# Patient Record
Sex: Male | Born: 1950 | Race: Black or African American | Hispanic: No | Marital: Married | Smoking: Former smoker
Health system: Southern US, Community
[De-identification: ages and names within clinical notes are randomized; demographics above are authoritative.]

## PROBLEM LIST (undated history)

## (undated) DIAGNOSIS — N183 Chronic kidney disease, stage 3 (moderate): Secondary | ICD-10-CM

## (undated) DIAGNOSIS — I10 Essential (primary) hypertension: Secondary | ICD-10-CM

## (undated) DIAGNOSIS — I509 Heart failure, unspecified: Secondary | ICD-10-CM

---

## 2005-02-20 ENCOUNTER — Ambulatory Visit (HOSPITAL_COMMUNITY): Admission: RE | Admit: 2005-02-20 | Discharge: 2005-02-20 | Payer: Self-pay | Admitting: Pulmonary Disease

## 2005-05-01 ENCOUNTER — Ambulatory Visit (HOSPITAL_COMMUNITY): Admission: RE | Admit: 2005-05-01 | Discharge: 2005-05-01 | Payer: Self-pay | Admitting: Pulmonary Disease

## 2005-05-07 ENCOUNTER — Inpatient Hospital Stay (HOSPITAL_COMMUNITY): Admission: AD | Admit: 2005-05-07 | Discharge: 2005-05-15 | Payer: Self-pay | Admitting: Cardiovascular Disease

## 2014-08-27 ENCOUNTER — Inpatient Hospital Stay (HOSPITAL_COMMUNITY): Payer: BC Managed Care – PPO

## 2014-08-27 ENCOUNTER — Inpatient Hospital Stay (HOSPITAL_COMMUNITY)
Admission: EM | Admit: 2014-08-27 | Discharge: 2014-08-29 | DRG: 101 | Disposition: A | Discharge status: Home / Self Care | Payer: BC Managed Care – PPO | Attending: Internal Medicine | Admitting: Internal Medicine

## 2014-08-27 ENCOUNTER — Emergency Department (HOSPITAL_COMMUNITY): Payer: BC Managed Care – PPO

## 2014-08-27 ENCOUNTER — Encounter (HOSPITAL_COMMUNITY): Payer: Self-pay

## 2014-08-27 DIAGNOSIS — I509 Heart failure, unspecified: Secondary | ICD-10-CM

## 2014-08-27 DIAGNOSIS — E876 Hypokalemia: Secondary | ICD-10-CM | POA: Diagnosis present

## 2014-08-27 DIAGNOSIS — E871 Hypo-osmolality and hyponatremia: Secondary | ICD-10-CM | POA: Diagnosis present

## 2014-08-27 DIAGNOSIS — I5022 Chronic systolic (congestive) heart failure: Secondary | ICD-10-CM | POA: Diagnosis present

## 2014-08-27 DIAGNOSIS — N184 Chronic kidney disease, stage 4 (severe): Secondary | ICD-10-CM | POA: Diagnosis present

## 2014-08-27 DIAGNOSIS — R569 Unspecified convulsions: Secondary | ICD-10-CM | POA: Diagnosis present

## 2014-08-27 DIAGNOSIS — Z79899 Other long term (current) drug therapy: Secondary | ICD-10-CM

## 2014-08-27 DIAGNOSIS — Z87891 Personal history of nicotine dependence: Secondary | ICD-10-CM

## 2014-08-27 DIAGNOSIS — I129 Hypertensive chronic kidney disease with stage 1 through stage 4 chronic kidney disease, or unspecified chronic kidney disease: Secondary | ICD-10-CM | POA: Diagnosis present

## 2014-08-27 DIAGNOSIS — N183 Chronic kidney disease, stage 3 (moderate): Secondary | ICD-10-CM

## 2014-08-27 DIAGNOSIS — F1010 Alcohol abuse, uncomplicated: Secondary | ICD-10-CM | POA: Diagnosis present

## 2014-08-27 DIAGNOSIS — I10 Essential (primary) hypertension: Secondary | ICD-10-CM

## 2014-08-27 DIAGNOSIS — N1832 Chronic kidney disease, stage 3b: Secondary | ICD-10-CM | POA: Diagnosis present

## 2014-08-27 HISTORY — DX: Chronic kidney disease, stage 3 (moderate): N18.3

## 2014-08-27 HISTORY — DX: Heart failure, unspecified: I50.9

## 2014-08-27 HISTORY — DX: Essential (primary) hypertension: I10

## 2014-08-27 HISTORY — DX: Chronic kidney disease, stage 3b: N18.32

## 2014-08-27 LAB — I-STAT VENOUS BLOOD GAS, ED
ACID-BASE EXCESS: 3 mmol/L — AB (ref 0.0–2.0)
BICARBONATE: 26.6 meq/L — AB (ref 20.0–24.0)
O2 SAT: 81 %
TCO2: 28 mmol/L (ref 0–100)
pCO2, Ven: 38.4 mmHg — ABNORMAL LOW (ref 45.0–50.0)
pH, Ven: 7.449 — ABNORMAL HIGH (ref 7.250–7.300)
pO2, Ven: 44 mmHg (ref 30.0–45.0)

## 2014-08-27 LAB — COMPREHENSIVE METABOLIC PANEL
ALT: 185 U/L — ABNORMAL HIGH (ref 0–53)
AST: 534 U/L — ABNORMAL HIGH (ref 0–37)
Albumin: 3 g/dL — ABNORMAL LOW (ref 3.5–5.2)
Alkaline Phosphatase: 57 U/L (ref 39–117)
Anion gap: 14 (ref 5–15)
BUN: 21 mg/dL (ref 6–23)
CO2: 25 mmol/L (ref 19–32)
Calcium: 8 mg/dL — ABNORMAL LOW (ref 8.4–10.5)
Chloride: 89 mmol/L — ABNORMAL LOW (ref 96–112)
Creatinine, Ser: 2.46 mg/dL — ABNORMAL HIGH (ref 0.50–1.35)
GFR calc Af Amer: 30 mL/min — ABNORMAL LOW (ref >90)
GFR calc non Af Amer: 26 mL/min — ABNORMAL LOW (ref >90)
Glucose, Bld: 154 mg/dL — ABNORMAL HIGH (ref 70–99)
Potassium: 2.9 mmol/L — ABNORMAL LOW (ref 3.5–5.1)
Sodium: 128 mmol/L — ABNORMAL LOW (ref 135–145)
Total Bilirubin: 1.6 mg/dL — ABNORMAL HIGH (ref 0.3–1.2)
Total Protein: 5.9 g/dL — ABNORMAL LOW (ref 6.0–8.3)

## 2014-08-27 LAB — URINALYSIS, ROUTINE W REFLEX MICROSCOPIC
Bilirubin Urine: NEGATIVE
Glucose, UA: NEGATIVE mg/dL
Ketones, ur: NEGATIVE mg/dL
Leukocytes, UA: NEGATIVE
Nitrite: NEGATIVE
PH: 6 (ref 5.0–8.0)
Protein, ur: 100 mg/dL — AB
Specific Gravity, Urine: 1.006 (ref 1.005–1.030)
UROBILINOGEN UA: 0.2 mg/dL (ref 0.0–1.0)

## 2014-08-27 LAB — RAPID URINE DRUG SCREEN, HOSP PERFORMED
Amphetamines: NOT DETECTED
Barbiturates: NOT DETECTED
Benzodiazepines: NOT DETECTED
Cocaine: NOT DETECTED
Opiates: NOT DETECTED
Tetrahydrocannabinol: NOT DETECTED

## 2014-08-27 LAB — CARBOXYHEMOGLOBIN
Carboxyhemoglobin: 1.8 % — ABNORMAL HIGH (ref 0.5–1.5)
METHEMOGLOBIN: 1 % (ref 0.0–1.5)
O2 Saturation: 79.3 %
Total hemoglobin: 17.3 g/dL (ref 13.5–18.0)

## 2014-08-27 LAB — I-STAT CHEM 8, ED
BUN: 22 mg/dL (ref 6–23)
Calcium, Ion: 1 mmol/L — ABNORMAL LOW (ref 1.13–1.30)
Chloride: 88 mmol/L — ABNORMAL LOW (ref 96–112)
Creatinine, Ser: 2.5 mg/dL — ABNORMAL HIGH (ref 0.50–1.35)
Glucose, Bld: 153 mg/dL — ABNORMAL HIGH (ref 70–99)
HCT: 43 % (ref 39.0–52.0)
Hemoglobin: 14.6 g/dL (ref 13.0–17.0)
Potassium: 2.9 mmol/L — ABNORMAL LOW (ref 3.5–5.1)
Sodium: 127 mmol/L — ABNORMAL LOW (ref 135–145)
TCO2: 21 mmol/L (ref 0–100)

## 2014-08-27 LAB — CBC WITH DIFFERENTIAL/PLATELET
Basophils Absolute: 0 10*3/uL (ref 0.0–0.1)
Basophils Relative: 0 % (ref 0–1)
Eosinophils Absolute: 0 10*3/uL (ref 0.0–0.7)
Eosinophils Relative: 0 % (ref 0–5)
HCT: 36.5 % — ABNORMAL LOW (ref 39.0–52.0)
Hemoglobin: 13.2 g/dL (ref 13.0–17.0)
Lymphocytes Relative: 10 % — ABNORMAL LOW (ref 12–46)
Lymphs Abs: 1.2 10*3/uL (ref 0.7–4.0)
MCH: 31.2 pg (ref 26.0–34.0)
MCHC: 36.2 g/dL — ABNORMAL HIGH (ref 30.0–36.0)
MCV: 86.3 fL (ref 78.0–100.0)
Monocytes Absolute: 0.9 10*3/uL (ref 0.1–1.0)
Monocytes Relative: 8 % (ref 3–12)
Neutro Abs: 9 10*3/uL — ABNORMAL HIGH (ref 1.7–7.7)
Neutrophils Relative %: 82 % — ABNORMAL HIGH (ref 43–77)
Platelets: 108 10*3/uL — ABNORMAL LOW (ref 150–400)
RBC: 4.23 MIL/uL (ref 4.22–5.81)
RDW: 15 % (ref 11.5–15.5)
WBC: 11.1 10*3/uL — ABNORMAL HIGH (ref 4.0–10.5)

## 2014-08-27 LAB — DIGOXIN LEVEL: Digoxin Level: 0.9 ng/mL (ref 0.8–2.0)

## 2014-08-27 LAB — POTASSIUM: Potassium: 3 mmol/L — ABNORMAL LOW (ref 3.5–5.1)

## 2014-08-27 LAB — MAGNESIUM: Magnesium: 1.8 mg/dL (ref 1.5–2.5)

## 2014-08-27 LAB — OSMOLALITY, URINE: Osmolality, Ur: 120 mOsm/kg — ABNORMAL LOW (ref 390–1090)

## 2014-08-27 LAB — URINE MICROSCOPIC-ADD ON

## 2014-08-27 LAB — LACTIC ACID, PLASMA: Lactic Acid, Venous: 3.4 mmol/L (ref 0.5–2.0)

## 2014-08-27 LAB — CBG MONITORING, ED: Glucose-Capillary: 155 mg/dL — ABNORMAL HIGH (ref 70–99)

## 2014-08-27 LAB — CREATININE, URINE, RANDOM: CREATININE, URINE: 46.76 mg/dL

## 2014-08-27 LAB — OSMOLALITY: OSMOLALITY: 272 mosm/kg — AB (ref 275–300)

## 2014-08-27 LAB — ETHANOL: Alcohol, Ethyl (B): <5 mg/dL (ref 0–9)

## 2014-08-27 LAB — AMMONIA: Ammonia: 28 umol/L (ref 11–32)

## 2014-08-27 LAB — SODIUM, URINE, RANDOM

## 2014-08-27 MED ORDER — CETYLPYRIDINIUM CHLORIDE 0.05 % MT LIQD
7.0000 mL | Freq: Two times a day (BID) | OROMUCOSAL | Status: DC
  Administered 2014-08-28: 7 mL via OROMUCOSAL

## 2014-08-27 MED ORDER — POTASSIUM CHLORIDE CRYS ER 20 MEQ PO TBCR
20.0000 meq | EXTENDED_RELEASE_TABLET | Freq: Every day | ORAL | Status: DC
  Administered 2014-08-27 – 2014-08-29 (×3): 20 meq via ORAL
  Filled 2014-08-27 (×3): qty 1

## 2014-08-27 MED ORDER — SODIUM CHLORIDE 0.9 % IV BOLUS (SEPSIS)
1000.0000 mL | Freq: Once | INTRAVENOUS | Status: AC
  Administered 2014-08-27: 1000 mL via INTRAVENOUS

## 2014-08-27 MED ORDER — SODIUM CHLORIDE 0.9 % IJ SOLN
3.0000 mL | Freq: Two times a day (BID) | INTRAMUSCULAR | Status: DC
  Administered 2014-08-27 – 2014-08-29 (×2): 3 mL via INTRAVENOUS

## 2014-08-27 MED ORDER — CLONIDINE HCL 0.1 MG PO TABS
0.2000 mg | ORAL_TABLET | Freq: Two times a day (BID) | ORAL | Status: DC
  Filled 2014-08-27: qty 2

## 2014-08-27 MED ORDER — LISINOPRIL 20 MG PO TABS
20.0000 mg | ORAL_TABLET | Freq: Once | ORAL | Status: AC
  Administered 2014-08-27: 20 mg via ORAL
  Filled 2014-08-27: qty 1

## 2014-08-27 MED ORDER — ALUM & MAG HYDROXIDE-SIMETH 200-200-20 MG/5ML PO SUSP: 30.0000 mL | Freq: Four times a day (QID) | ORAL | Status: DC | PRN

## 2014-08-27 MED ORDER — POLYETHYLENE GLYCOL 3350 17 G PO PACK: 17.0000 g | PACK | Freq: Every day | ORAL | Status: DC | PRN

## 2014-08-27 MED ORDER — ONDANSETRON HCL 4 MG PO TABS: 4.0000 mg | ORAL_TABLET | Freq: Four times a day (QID) | ORAL | Status: DC | PRN

## 2014-08-27 MED ORDER — ALLOPURINOL 100 MG PO TABS
300.0000 mg | ORAL_TABLET | Freq: Every day | ORAL | Status: DC
  Administered 2014-08-27 – 2014-08-29 (×3): 300 mg via ORAL
  Filled 2014-08-27 (×3): qty 3

## 2014-08-27 MED ORDER — ALPRAZOLAM 0.25 MG PO TABS: 0.2500 mg | ORAL_TABLET | Freq: Two times a day (BID) | ORAL | Status: DC

## 2014-08-27 MED ORDER — POTASSIUM CHLORIDE CRYS ER 20 MEQ PO TBCR
40.0000 meq | EXTENDED_RELEASE_TABLET | ORAL | Status: AC
  Administered 2014-08-27 (×2): 40 meq via ORAL
  Filled 2014-08-27: qty 2

## 2014-08-27 MED ORDER — POTASSIUM CHLORIDE IN NACL 20-0.9 MEQ/L-% IV SOLN
INTRAVENOUS | Status: DC
  Administered 2014-08-27 – 2014-08-28 (×2): via INTRAVENOUS
  Filled 2014-08-27 (×2): qty 1000

## 2014-08-27 MED ORDER — FOLIC ACID 1 MG PO TABS
1.0000 mg | ORAL_TABLET | Freq: Every day | ORAL | Status: DC
  Administered 2014-08-27 – 2014-08-29 (×3): 1 mg via ORAL
  Filled 2014-08-27 (×3): qty 1

## 2014-08-27 MED ORDER — POTASSIUM CHLORIDE CRYS ER 20 MEQ PO TBCR
40.0000 meq | EXTENDED_RELEASE_TABLET | Freq: Once | ORAL | Status: AC
  Administered 2014-08-27: 40 meq via ORAL
  Filled 2014-08-27: qty 2

## 2014-08-27 MED ORDER — HYDROCODONE-ACETAMINOPHEN 5-325 MG PO TABS: 1.0000 | ORAL_TABLET | ORAL | Status: DC | PRN

## 2014-08-27 MED ORDER — LORAZEPAM 1 MG PO TABS
1.0000 mg | ORAL_TABLET | Freq: Four times a day (QID) | ORAL | Status: DC | PRN
  Administered 2014-08-27 – 2014-08-29 (×2): 1 mg via ORAL
  Filled 2014-08-27 (×2): qty 1

## 2014-08-27 MED ORDER — HEPARIN SODIUM (PORCINE) 5000 UNIT/ML IJ SOLN
5000.0000 [IU] | Freq: Three times a day (TID) | INTRAMUSCULAR | Status: DC
  Administered 2014-08-27 – 2014-08-29 (×6): 5000 [IU] via SUBCUTANEOUS
  Filled 2014-08-27 (×6): qty 1

## 2014-08-27 MED ORDER — GUAIFENESIN-DM 100-10 MG/5ML PO SYRP: 5.0000 mL | ORAL_SOLUTION | ORAL | Status: DC | PRN

## 2014-08-27 MED ORDER — HYDRALAZINE HCL 20 MG/ML IJ SOLN
10.0000 mg | Freq: Four times a day (QID) | INTRAMUSCULAR | Status: DC | PRN
  Administered 2014-08-27 – 2014-08-29 (×3): 10 mg via INTRAVENOUS
  Filled 2014-08-27 (×3): qty 1

## 2014-08-27 MED ORDER — VITAMIN B-1 100 MG PO TABS
100.0000 mg | ORAL_TABLET | Freq: Every day | ORAL | Status: DC
Start: 2014-08-27 — End: 2014-08-29
  Administered 2014-08-27 – 2014-08-29 (×3): 100 mg via ORAL
  Filled 2014-08-27 (×3): qty 1

## 2014-08-27 MED ORDER — CLONIDINE HCL 0.2 MG PO TABS
0.2000 mg | ORAL_TABLET | Freq: Once | ORAL | Status: AC
  Administered 2014-08-27: 0.2 mg via ORAL
  Filled 2014-08-27: qty 1

## 2014-08-27 MED ORDER — LORAZEPAM 2 MG/ML IJ SOLN: 0.0000 mg | Freq: Two times a day (BID) | INTRAMUSCULAR | Status: DC

## 2014-08-27 MED ORDER — THIAMINE HCL 100 MG/ML IJ SOLN: 100.0000 mg | Freq: Every day | INTRAMUSCULAR | Status: DC

## 2014-08-27 MED ORDER — DIGOXIN 125 MCG PO TABS
250.0000 ug | ORAL_TABLET | Freq: Every day | ORAL | Status: DC
  Administered 2014-08-27 – 2014-08-29 (×3): 250 ug via ORAL
  Filled 2014-08-27 (×3): qty 2

## 2014-08-27 MED ORDER — ADULT MULTIVITAMIN W/MINERALS CH
1.0000 | ORAL_TABLET | Freq: Every day | ORAL | Status: DC
  Administered 2014-08-27 – 2014-08-29 (×3): 1 via ORAL
  Filled 2014-08-27 (×3): qty 1

## 2014-08-27 MED ORDER — LORAZEPAM 2 MG/ML IJ SOLN: 0.0000 mg | Freq: Four times a day (QID) | INTRAMUSCULAR | Status: AC

## 2014-08-27 MED ORDER — LORAZEPAM 2 MG/ML IJ SOLN: 2.0000 mg | INTRAMUSCULAR | Status: DC | PRN

## 2014-08-27 MED ORDER — ONDANSETRON HCL 4 MG/2ML IJ SOLN: 4.0000 mg | Freq: Four times a day (QID) | INTRAMUSCULAR | Status: DC | PRN

## 2014-08-27 MED ORDER — LORAZEPAM 2 MG/ML IJ SOLN
1.0000 mg | Freq: Four times a day (QID) | INTRAMUSCULAR | Status: DC | PRN
Start: 2014-08-27 — End: 2014-08-29

## 2014-08-27 MED ORDER — TRAZODONE HCL 100 MG PO TABS
100.0000 mg | ORAL_TABLET | Freq: Every day | ORAL | Status: DC
  Administered 2014-08-27 – 2014-08-28 (×2): 100 mg via ORAL
  Filled 2014-08-27 (×2): qty 1

## 2014-08-27 MED ORDER — CLONIDINE HCL 0.1 MG PO TABS
0.2000 mg | ORAL_TABLET | Freq: Three times a day (TID) | ORAL | Status: DC
  Administered 2014-08-27 – 2014-08-29 (×6): 0.2 mg via ORAL
  Filled 2014-08-27 (×6): qty 2

## 2014-08-27 MED ORDER — HYDRALAZINE HCL 50 MG PO TABS
50.0000 mg | ORAL_TABLET | Freq: Three times a day (TID) | ORAL | Status: DC
  Administered 2014-08-27 – 2014-08-29 (×5): 50 mg via ORAL
  Filled 2014-08-27 (×5): qty 1

## 2014-08-27 MED ORDER — CARVEDILOL 12.5 MG PO TABS
12.5000 mg | ORAL_TABLET | Freq: Two times a day (BID) | ORAL | Status: DC
  Administered 2014-08-27 – 2014-08-29 (×5): 12.5 mg via ORAL
  Filled 2014-08-27 (×7): qty 1

## 2014-08-27 MED ORDER — HYDRALAZINE HCL 20 MG/ML IJ SOLN
10.0000 mg | Freq: Once | INTRAMUSCULAR | Status: AC
  Administered 2014-08-27: 10 mg via INTRAVENOUS
  Filled 2014-08-27: qty 1

## 2014-08-27 NOTE — Progress Notes (Signed)
EEG completed; results pending.    

## 2014-08-27 NOTE — ED Notes (Signed)
Pt's wife reports patient was in the garage last night with car running and passed out in the drivers seat. Pt denies this was intentional and denies suicidal ideation. Admitting MD aware of this incident and will order carboxyhemoglobin.

## 2014-08-27 NOTE — H&P (Addendum)
Patient Demographics  Logan Thomas, is a 64 y.o. male  MRN: [id number]   DOB - December 23, 1950  Admit Date - 08/27/2014  Outpatient Primary MD for the patient is Leola Brazil, MD   With History of -  Past Medical History  Diagnosis Date  . Hypertension   . CHF (congestive heart failure)   . 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 Essential hypertension   . CHF (congestive heart failure)       No past surgical history on file.  in for   Chief Complaint  Patient presents with  . Altered Mental Status     HPI  Logan Thomas  is a 64 y.o. male, with history of essential hypertension, chronic and it disease stage IV Baseline creatinine not in system, chronic congestive heart failure unclear type no echo in the chart, history of alcohol abuse supposedly much improved now and drinking only 2-3 times a week for the last 1 month, not very forthcoming with information. History of seizure several years ago thought to be secondary to alcohol withdrawal.  Patient comes in after having a seizure-like episode this morning, he also had a passing out spell while sitting in his car yesterday, currently denies any fever or chills or headache, no head injuries, no history of stroke, gives inconsistent alcohol intake history, no chest or abdominal pain, no coughing shortness of breath, no exposure to sick contacts. No focal weakness.  In the ER lab work suggestive of chronic kidney disease stage IV, he is hyponatremic with hypokalemia, head CT unremarkable. Neuro was consulted and I was requested to admit the patient for seizures.    Review of Systems  currently negative  In addition to the HPI above,   No Fever-chills, No Headache, No changes with Vision or  hearing, No problems swallowing food or Liquids, No Chest pain, Cough or Shortness of Breath, No Abdominal pain, No Nausea or Vommitting, Bowel movements are regular, No Blood in stool or Urine, No dysuria, No new skin rashes or bruises, No new joints pains-aches,  No new weakness, tingling, numbness in any extremity, No recent weight gain or loss, No polyuria, polydypsia or polyphagia, No significant Mental Stressors.  A full 10 point Review of Systems was done, except as stated above, all other Review of Systems were negative.   Social History History  Substance Use Topics  . Smoking status: Former Research scientist (life sciences)  . Smokeless tobacco: Not on file  . Alcohol Use: Yes      Family History No history of seizures or stroke  Prior to Admission medications   Medication Sig Start Date End Date Taking? Authorizing Provider  allopurinol (ZYLOPRIM) 300 MG tablet Take 300 mg by mouth daily. 07/08/14  Yes Historical Provider, MD  ALPRAZolam Duanne Moron) 0.25 MG tablet Take 0.25 mg by mouth 2 (two) times daily. 06/01/14  Yes Historical Provider,  MD  carvedilol (COREG) 12.5 MG tablet Take 12.5 mg by mouth 2 (two) times daily. 06/24/14  Yes Historical Provider, MD  cloNIDine (CATAPRES) 0.2 MG tablet Take 0.2 mg by mouth 2 (two) times daily. 06/24/14  Yes Historical Provider, MD  digoxin (LANOXIN) 0.25 MG tablet Take 250 mcg by mouth 2 (two) times daily. 07/08/14  Yes Historical Provider, MD  furosemide (LASIX) 40 MG tablet Take 40 mg by mouth 2 (two) times daily. 05/27/14  Yes Historical Provider, MD  KLOR-CON M20 20 MEQ tablet Take 20 mEq by mouth daily. 07/28/14  Yes Historical Provider, MD  lisinopril (PRINIVIL,ZESTRIL) 20 MG tablet Take 20 mg by mouth daily. 06/24/14  Yes Historical Provider, MD  Multiple Vitamins-Minerals (CENTRUM SILVER ADULT 50+) TABS Take 1 tablet by mouth daily.   Yes Historical Provider, MD  traZODone (DESYREL) 100 MG tablet Take 100 mg by mouth at bedtime. 08/01/14  Yes Historical  Provider, MD    No Known Allergies  Physical Exam  Vitals  Blood pressure 186/106, pulse 81, temperature 97.8 F (36.6 C), temperature source Oral, resp. rate 21, SpO2 99 %.   1. General middle aged [place] male lying in bed in NAD,     2. Normal affect and insight, Not Suicidal or Homicidal, Awake Alert, Oriented X 3.  3. No F.N deficits, ALL C.Nerves Intact, Strength 5/5 all 4 extremities, Sensation intact all 4 extremities, Plantars down going.  4. Ears and Eyes appear Normal, Conjunctivae clear, PERRLA. Moist Oral Mucosa.  5. Supple Neck, No JVD, No cervical lymphadenopathy appriciated, No Carotid Bruits.  6. Symmetrical Chest wall movement, Good air movement bilaterally, CTAB.  7. RRR, No Gallops, Rubs or Murmurs, No Parasternal Heave.  8. Positive Bowel Sounds, Abdomen Soft, No tenderness, No organomegaly appriciated,No rebound -guarding or rigidity.  9.  No Cyanosis, Normal Skin Turgor, No Skin Rash or Bruise.  10. Good muscle tone,  joints appear normal , no effusions, Normal ROM.  11. No Palpable Lymph Nodes in Neck or Axillae     Data Review  CBC  Recent Labs Lab 08/27/14 0817 08/27/14 0827  WBC 11.1*  --   HGB 13.2 14.6  HCT 36.5* 43.0  PLT 108*  --   MCV 86.3  --   MCH 31.2  --   MCHC 36.2*  --   RDW 15.0  --   LYMPHSABS 1.2  --   MONOABS 0.9  --   EOSABS 0.0  --   BASOSABS 0.0  --    ------------------------------------------------------------------------------------------------------------------  Chemistries   Recent Labs Lab 08/27/14 0817 08/27/14 0827  NA 128* 127*  K 2.9* 2.9*  CL 89* 88*  CO2 25  --   GLUCOSE 154* 153*  BUN 21 22  CREATININE 2.46* 2.50*  CALCIUM 8.0*  --   AST 534*  --   ALT 185*  --   ALKPHOS 57  --   BILITOT 1.6*  --    ------------------------------------------------------------------------------------------------------------------ CrCl cannot be calculated (Unknown ideal  weight.). ------------------------------------------------------------------------------------------------------------------ No results for input(s): TSH, T4TOTAL, T3FREE, THYROIDAB in the last 72 hours.  Invalid input(s): FREET3   Coagulation profile No results for input(s): INR, PROTIME in the last 168 hours. ------------------------------------------------------------------------------------------------------------------- No results for input(s): DDIMER in the last 72 hours. -------------------------------------------------------------------------------------------------------------------  Cardiac Enzymes No results for input(s): CKMB, TROPONINI, MYOGLOBIN in the last 168 hours.  Invalid input(s): CK ------------------------------------------------------------------------------------------------------------------ Invalid input(s): POCBNP   ---------------------------------------------------------------------------------------------------------------  Urinalysis No results found for: COLORURINE, APPEARANCEUR, [place], [place], [place], [place], [place], [place], [place],  UROBILINOGEN, NITRITE, LEUKOCYTESUR  ----------------------------------------------------------------------------------------------------------------  Imaging results:   Ct Head Wo Contrast  08/27/2014   CLINICAL DATA:  History of alcohol withdrawal seizures. Probable seizure last night or this morning. Found unresponsive.  EXAM: CT HEAD WITHOUT CONTRAST  TECHNIQUE: Contiguous axial images were obtained from the base of the skull through the vertex without intravenous contrast.  COMPARISON:  05/10/2005  FINDINGS: The brain has a normal appearance without evidence of atrophy, infarction, mass lesion, hemorrhage, hydrocephalus or extra-axial collection. The calvarium is unremarkable. The paranasal sinuses, middle ears and mastoids are clear.  IMPRESSION: Normal head CT   Electronically Signed   By: Nelson Chimes  M.D.   On: 08/27/2014 08:51   Dg Chest Portable 1 View  08/27/2014   CLINICAL DATA:  CHF.  Seizure this morning.  EXAM: PORTABLE CHEST - 1 VIEW  COMPARISON:  05/10/2005  FINDINGS: The heart size and mediastinal contours are within normal limits. Both lungs are clear. The visualized skeletal structures are unremarkable.  IMPRESSION: No active disease.   Electronically Signed   By: Rolm Baptise M.D.   On: 08/27/2014 08:33    My personal review of EKG: Rhythm NSR,   no Acute ST changes    Assessment & Plan   1. Seizure. Question alcohol withdrawal but patient not go forthcoming with history, will be admitted to a telemetry bed, urology consulted, CT head unremarkable. Will obtain MRI brain. Seizure precautions. When necessary IV Ativan as needed. Check EEG. Check carboxyhemoglobin level as he passed out yesterday in the car. Doubt this is the cause.   2. Alcohol abuse. Question quantity. Inconsistent history. Counseled to quit. CIWA protocol.   3. Essential hypertension. Continue home medications as needed IV hydralazine.   4. Chronic kidney disease stage IV. Baseline creatinine not consistent, requested to obtain records from Dr. Merrilee Jansky office. Gently hydrate.   Called by Dr Doylene Canard baseline Creat 2.6.   5. Hyponatremia and hypokalemia. Likely dehydration, hold Lasix, obtain urine electrolytes, gently hydrate and repeat. Potassium replaced.   6. Chronic CHF. Unclear type no echo in system. Echo report requested from Dr. Merrilee Jansky office. Were bullae per Dr. Doylene Canard EF likely preserved and he has chronic diastolic CHF. He will update the information in his note. He wants to be consulted.     DVT Prophylaxis Heparin   AM Labs Ordered, also please review Full Orders  Family Communication: Admission, patients condition and plan of care including tests being ordered have been discussed with the patient and wife who indicate understanding and agree with the plan and Code  Status.  Code Status Full  Likely DC to Home  Condition GUARDED   Time spent in minutes : 35    Lucio Litsey K M.D on 08/27/2014 at 10:04 AM  Between 7am to 7pm - Pager - [phone]  After 7pm go to [email or web address] - [facility] Office  [phone]

## 2014-08-27 NOTE — Progress Notes (Signed)
Echocardiogram 2D Echocardiogram has been performed.  Logan Thomas 08/27/2014, 1:50 PM

## 2014-08-27 NOTE — ED Notes (Signed)
[facility]- family member woke up and pt was "unresponsive." Pt has hx of alcohol withdrawal seizures. Appeared to be post-ictal to EMS. CBG 158. Confused on EMS arrival but more responsive en route. Pt reports he feels fine on arrival.

## 2014-08-27 NOTE — Procedures (Signed)
ELECTROENCEPHALOGRAM REPORT  Date of Study: 08/27/2014  Patient's Name: Logan Thomas MRN: [id number] Date of Birth: 09-26-1950  Referring Provider: Dr. Lala Lund  Clinical History: This is a 64 year old man with an episode of rigidity and unresponsiveness this morning. Patient is still lethargic and is not yet at his baseline mental status. His wife reports that he was lethargic all day yesterday as well.   Medications: allopurinol (ZYLOPRIM) tablet 300 mg  ALPRAZolam (XANAX) tablet 0.25 mg  alum & mag hydroxide-simeth (MAALOX/MYLANTA) 200-200-20 MG/5ML suspension 30 mL  carvedilol (COREG) tablet 12.5 mg  cloNIDine (CATAPRES) tablet 0.2 mg  digoxin (LANOXIN) tablet [id number] mcg  folic acid (FOLVITE) tablet 1 mg  guaiFENesin-dextromethorphan (ROBITUSSIN DM) 100-10 MG/5ML syrup 5 mL  lisinopril (PRINIVIL,ZESTRIL) tablet 20 mg  thiamine (B-1) injection 100 mg  traZODone (DESYREL) tablet 100 mg   Technical Summary: A multichannel digital EEG recording measured by the international 10-20 system with electrodes applied with paste and impedances below 5000 ohms performed in our laboratory with EKG monitoring in a predominantly drowsy and asleep patient.  Hyperventilation and photic stimulation were performed.  The digital EEG was referentially recorded, reformatted, and digitally filtered in a variety of bipolar and referential montages for optimal display.    Description: The patient is predominantly drowsy and asleep during the recording.  During brief period of wakefulness, there is a symmetric, low to medium voltage 8 Hz posterior dominant rhythm that poorly attenuates to eye opening and eye closure. The record is symmetric.  During drowsiness and sleep, the background is attenuated with low voltage theta and delta slowing of the background.  Low voltage vertex waves and symmetric sleep spindles were seen. There are rare low voltage sharp transients seen during sleep in a generalized  fashion, seen better at 3 uV/mm sensitivity. Hyperventilation and photic stimulation did not elicit any abnormalities.  There were no clear epileptiform discharges or electrographic seizures seen.    EKG lead was unremarkable.  Impression: This predominantly drowsy and asleep EEG is within normal limits. There is diffuse low voltage activity at the beginning of the recording, however as he wakes up, the record is normal. The low voltage sharp transients seen during sleep are of unclear clinical significance.   Clinical Correlation: A normal EEG does not exclude a clinical diagnosis of epilepsy.  If further clinical questions remain, repeat EEG may be helpful. Clinical correlation is advised.   Ellouise Newer, M.D.

## 2014-08-27 NOTE — Consult Note (Signed)
Referring Physician: Dr. Marvell Fuller, Brooke Bonito.  Logan Thomas is an 64 y.o. male.                       Chief Complaint: Seizures  HPI: 64 y.o. male, with history of essential hypertension, chronic kidney disease, dilated cardiomyopathy, chronic systolic heart failure, history of alcohol abuse and history of seizure several years ago thought to be secondary to alcohol withdrawal, comes in after having a seizure-like episode this morning.  He also had a passing out spell while sitting in his car yesterday. No fever or chills or headache, no head injuries, no history of stroke, positive alcohol intake history, no chest or abdominal pain, no coughing shortness of breath, no exposure to sick contacts. No focal weakness.   Past Medical History  Diagnosis Date  . Hypertension   . CHF (congestive heart failure)   . 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 Essential hypertension   . CHF (congestive heart failure)       No past surgical history on file.  No family history on file. Social History:  reports that he has quit smoking. He does not have any smokeless tobacco history on file. He reports that he does not drink alcohol. His drug history is not on file.  Allergies: No Known Allergies  Medications Prior to Admission  Medication Sig Dispense Refill  . allopurinol (ZYLOPRIM) 300 MG tablet Take 300 mg by mouth daily.    Marland Kitchen ALPRAZolam (XANAX) 0.25 MG tablet Take 0.25 mg by mouth 2 (two) times daily.    . carvedilol (COREG) 12.5 MG tablet Take 12.5 mg by mouth 2 (two) times daily.    . cloNIDine (CATAPRES) 0.2 MG tablet Take 0.2 mg by mouth 2 (two) times daily.    . digoxin (LANOXIN) 0.25 MG tablet Take 250 mcg by mouth daily.     . furosemide (LASIX) 40 MG tablet Take 40 mg by mouth 2 (two) times daily.    Marland Kitchen KLOR-CON M20 20 MEQ tablet Take 20 mEq by mouth daily.    Marland Kitchen lisinopril (PRINIVIL,ZESTRIL) 20 MG tablet Take 20 mg by mouth daily.    .  Multiple Vitamins-Minerals (CENTRUM SILVER ADULT 50+) TABS Take 1 tablet by mouth daily.    . traZODone (DESYREL) 100 MG tablet Take 100 mg by mouth at bedtime.      Results for orders placed or performed during the hospital encounter of 08/27/14 (from the past 48 hour(s))  Comprehensive metabolic panel     Status: Abnormal   Collection Time: 08/27/14  8:17 AM  Result Value Ref Range   Sodium 128 (L) 135 - 145 mmol/L   Potassium 2.9 (L) 3.5 - 5.1 mmol/L   Chloride 89 (L) 96 - 112 mmol/L   CO2 25 19 - 32 mmol/L   Glucose, Bld 154 (H) 70 - 99 mg/dL   BUN 21 6 - 23 mg/dL   Creatinine, Ser 2.46 (H) 0.50 - 1.35 mg/dL   Calcium 8.0 (L) 8.4 - 10.5 mg/dL   Total Protein 5.9 (L) 6.0 - 8.3 g/dL   Albumin 3.0 (L) 3.5 - 5.2 g/dL   AST 534 (H) 0 - 37 U/L   ALT 185 (H) 0 - 53 U/L   Alkaline Phosphatase 57 39 - 117 U/L   Total Bilirubin 1.6 (H) 0.3 - 1.2 mg/dL   GFR calc non Af Amer 26 (L) >90 mL/min  GFR calc Af Amer 30 (L) >90 mL/min    Comment: (NOTE) The eGFR has been calculated using the CKD EPI equation. This calculation has not been validated in all clinical situations. eGFR's persistently <90 mL/min signify possible Chronic Kidney Disease.    Anion gap 14 5 - 15  CBC with Differential     Status: Abnormal   Collection Time: 08/27/14  8:17 AM  Result Value Ref Range   WBC 11.1 (H) 4.0 - 10.5 K/uL   RBC 4.23 4.22 - 5.81 MIL/uL   Hemoglobin 13.2 13.0 - 17.0 g/dL   HCT 36.5 (L) 39.0 - 52.0 %   MCV 86.3 78.0 - 100.0 fL   MCH 31.2 26.0 - 34.0 pg   MCHC 36.2 (H) 30.0 - 36.0 g/dL   RDW 15.0 11.5 - 15.5 %   Platelets 108 (L) 150 - 400 K/uL    Comment: PLATELET COUNT CONFIRMED BY SMEAR   Neutrophils Relative % 82 (H) 43 - 77 %   Neutro Abs 9.0 (H) 1.7 - 7.7 K/uL   Lymphocytes Relative 10 (L) 12 - 46 %   Lymphs Abs 1.2 0.7 - 4.0 K/uL   Monocytes Relative 8 3 - 12 %   Monocytes Absolute 0.9 0.1 - 1.0 K/uL   Eosinophils Relative 0 0 - 5 %   Eosinophils Absolute 0.0 0.0 - 0.7 K/uL    Basophils Relative 0 0 - 1 %   Basophils Absolute 0.0 0.0 - 0.1 K/uL  POC CBG, ED     Status: Abnormal   Collection Time: 08/27/14  8:20 AM  Result Value Ref Range   Glucose-Capillary 155 (H) 70 - 99 mg/dL   Comment 1 Documented in Chart    Comment 2 Notify RN   Ethanol     Status: None   Collection Time: 08/27/14  8:22 AM  Result Value Ref Range   Alcohol, Ethyl (B) <5 0 - 9 mg/dL    Comment:        LOWEST DETECTABLE LIMIT FOR SERUM ALCOHOL IS 11 mg/dL FOR MEDICAL PURPOSES ONLY   I-stat chem 8, ed     Status: Abnormal   Collection Time: 08/27/14  8:27 AM  Result Value Ref Range   Sodium 127 (L) 135 - 145 mmol/L   Potassium 2.9 (L) 3.5 - 5.1 mmol/L   Chloride 88 (L) 96 - 112 mmol/L   BUN 22 6 - 23 mg/dL   Creatinine, Ser 2.50 (H) 0.50 - 1.35 mg/dL   Glucose, Bld 153 (H) 70 - 99 mg/dL   Calcium, Ion 1.00 (L) 1.13 - 1.30 mmol/L   TCO2 21 0 - 100 mmol/L   Hemoglobin 14.6 13.0 - 17.0 g/dL   HCT 43.0 39.0 - 52.0 %  Magnesium     Status: None   Collection Time: 08/27/14  9:39 AM  Result Value Ref Range   Magnesium 1.8 1.5 - 2.5 mg/dL  Ammonia     Status: None   Collection Time: 08/27/14  9:39 AM  Result Value Ref Range   Ammonia 28 11 - 32 umol/L  Lactic acid, plasma     Status: Abnormal   Collection Time: 08/27/14  9:39 AM  Result Value Ref Range   Lactic Acid, Venous 3.4 (HH) 0.5 - 2.0 mmol/L    Comment: REPEATED TO VERIFY CRITICAL RESULT CALLED TO, READ BACK BY AND VERIFIED WITH: F.STHETTINO,RN 08/27/14 1051 BY BSLADE   Digoxin level     Status: None   Collection Time: 08/27/14  9:39 AM  Result Value Ref Range   Digoxin Level 0.9 0.8 - 2.0 ng/mL  I-Stat Venous Blood Gas, ED (order at [facility] and [facility] only)     Status: Abnormal   Collection Time: 08/27/14  9:54 AM  Result Value Ref Range   pH, Ven 7.449 (H) 7.250 - 7.300   pCO2, Ven 38.4 (L) 45.0 - 50.0 mmHg   pO2, Ven 44.0 30.0 - 45.0 mmHg   Bicarbonate 26.6 (H) 20.0 - 24.0 mEq/L   TCO2 28 0 - 100 mmol/L   O2 Saturation  81.0 %   Acid-Base Excess 3.0 (H) 0.0 - 2.0 mmol/L   Sample type VENOUS   Carboxyhemoglobin     Status: Abnormal   Collection Time: 08/27/14  9:55 AM  Result Value Ref Range   Total hemoglobin 17.3 13.5 - 18.0 g/dL   O2 Saturation 79.3 %   Carboxyhemoglobin 1.8 (H) 0.5 - 1.5 %   Methemoglobin 1.0 0.0 - 1.5 %   Ct Head Wo Contrast  08/27/2014   CLINICAL DATA:  History of alcohol withdrawal seizures. Probable seizure last night or this morning. Found unresponsive.  EXAM: CT HEAD WITHOUT CONTRAST  TECHNIQUE: Contiguous axial images were obtained from the base of the skull through the vertex without intravenous contrast.  COMPARISON:  05/10/2005  FINDINGS: The brain has a normal appearance without evidence of atrophy, infarction, mass lesion, hemorrhage, hydrocephalus or extra-axial collection. The calvarium is unremarkable. The paranasal sinuses, middle ears and mastoids are clear.  IMPRESSION: Normal head CT   Electronically Signed   By: Nelson Chimes M.D.   On: 08/27/2014 08:51   Dg Chest Portable 1 View  08/27/2014   CLINICAL DATA:  CHF.  Seizure this morning.  EXAM: PORTABLE CHEST - 1 VIEW  COMPARISON:  05/10/2005  FINDINGS: The heart size and mediastinal contours are within normal limits. Both lungs are clear. The visualized skeletal structures are unremarkable.  IMPRESSION: No active disease.   Electronically Signed   By: Rolm Baptise M.D.   On: 08/27/2014 08:33    Review Of Systems No Fever-chills, No Headache, No changes with Vision or hearing, No problems swallowing food or Liquids, No Chest pain, Cough or Shortness of Breath, No Abdominal pain, No Nausea or Vommitting, Bowel movements are regular, No Blood in stool or Urine, No dysuria, No hepatitis. No new skin rashes or bruises, No new joints pains-aches,  No new weakness, tingling, numbness in any extremity, No recent weight gain or loss, No polyuria, polydypsia or polyphagia  Blood pressure 173/99, pulse 79, temperature 98.9 F  (37.2 C), temperature source Oral, resp. rate 21, SpO2 99 %. Physical exam: HEENT: Normocephalic. Brown eyes, PERL, Bite marks on tongue and small right sided laceration of tongue. Neck: No JVD, Supple. Lungs: Clear to auscultation. Heart: S1, S2 normal. Regular rhythm. Abdomen: Soft and non-tender. Ext: No edema, cyanosis or clubbing. Skin: Warm and dry. CNS: Moves all 4 extremities. Cranial nerves grossly intact.  Assessment/Plan Seizures Syncope Alcohol use disorder Dilated cardiomyopathy Chronic kidney disease, IV Hypertension Hyponatremia  Hypokalemia  Agree with medical treatment, IV saline. Will get 2-D echo.  Birdie Riddle, MD  08/27/2014, 11:12 AM

## 2014-08-27 NOTE — ED Provider Notes (Signed)
CSN: [id number]     Arrival date & time 08/27/14  0759 History   First MD Initiated Contact with Patient 08/27/14 0800     Chief Complaint  Patient presents with  . Altered Mental Status     (Consider location/radiation/quality/duration/timing/severity/associated sxs/prior Treatment) Patient is a 64 y.o. male presenting with altered mental status. The history is provided by the patient, the EMS personnel and the spouse. A language interpreter was used.  Altered Mental Status Presenting symptoms: confusion   Severity:  Mild Most recent episode:  Today Episode history:  Single Timing:  Constant Progression:  Resolved Chronicity:  New Context: not alcohol use, not head injury, not a nursing home resident and not a recent illness   Associated symptoms: no abdominal pain, no agitation, no fever, no nausea and no vomiting     Past Medical History  Diagnosis Date  . Hypertension   . CHF (congestive heart failure)   . 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 Essential hypertension   . CHF (congestive heart failure)    No past surgical history on file. No family history on file. History  Substance Use Topics  . Smoking status: Former Research scientist (life sciences)  . Smokeless tobacco: Not on file  . Alcohol Use: No    Review of Systems  Constitutional: Negative for fever.  Respiratory: Negative for cough and shortness of breath.   Cardiovascular: Negative for chest pain.  Gastrointestinal: Negative for nausea, vomiting and abdominal pain.  Genitourinary: Negative for dysuria, urgency and frequency.  Psychiatric/Behavioral: Positive for confusion. Negative for agitation.  All other systems reviewed and are negative.     Allergies  Review of patient's allergies indicates no known allergies.  Home Medications   Prior to Admission medications   Medication Sig Start Date End Date Taking? Authorizing Provider  allopurinol (ZYLOPRIM) 300 MG tablet Take  300 mg by mouth daily. 07/08/14  Yes Historical Provider, MD  ALPRAZolam Duanne Moron) 0.25 MG tablet Take 0.25 mg by mouth 2 (two) times daily. 06/01/14  Yes Historical Provider, MD  carvedilol (COREG) 12.5 MG tablet Take 12.5 mg by mouth 2 (two) times daily. 06/24/14  Yes Historical Provider, MD  cloNIDine (CATAPRES) 0.2 MG tablet Take 0.2 mg by mouth 2 (two) times daily. 06/24/14  Yes Historical Provider, MD  digoxin (LANOXIN) 0.25 MG tablet Take 250 mcg by mouth 2 (two) times daily. 07/08/14  Yes Historical Provider, MD  furosemide (LASIX) 40 MG tablet Take 40 mg by mouth 2 (two) times daily. 05/27/14  Yes Historical Provider, MD  KLOR-CON M20 20 MEQ tablet Take 20 mEq by mouth daily. 07/28/14  Yes Historical Provider, MD  lisinopril (PRINIVIL,ZESTRIL) 20 MG tablet Take 20 mg by mouth daily. 06/24/14  Yes Historical Provider, MD  Multiple Vitamins-Minerals (CENTRUM SILVER ADULT 50+) TABS Take 1 tablet by mouth daily.   Yes Historical Provider, MD  traZODone (DESYREL) 100 MG tablet Take 100 mg by mouth at bedtime. 08/01/14  Yes Historical Provider, MD   BP 180/107 mmHg  Pulse 81  Temp(Src) 97.8 F (36.6 C) (Oral)  Resp 21  SpO2 99% Physical Exam  Constitutional: He is oriented to person, place, and time. He appears well-developed and well-nourished. No distress.  HENT:  Head: Normocephalic and atraumatic.  Bite marks to both sides of the tongue.  Small laceration to the R side of the tongue.    Eyes: Pupils are equal, round, and reactive to light.  Neck: Normal  range of motion.  Cardiovascular: Normal rate, regular rhythm and normal heart sounds.   Pulmonary/Chest: Effort normal and breath sounds normal. No respiratory distress. He has no decreased breath sounds. He has no wheezes. He has no rhonchi. He has no rales.  Abdominal: Soft. He exhibits no distension. There is no tenderness. There is no rebound and no guarding.  Musculoskeletal: He exhibits no edema or tenderness.  Neurological: He is  alert and oriented to person, place, and time. No cranial nerve deficit or sensory deficit. He exhibits normal muscle tone. Coordination and gait normal.  Strength 5/5 bilateral upper and lower extremities.  Sensation intact x4 extremities.  CN II-XII intact.    Skin: Skin is warm and dry.  Nursing note and vitals reviewed.   ED Course  Procedures (including critical care time) Labs Review Labs Reviewed  COMPREHENSIVE METABOLIC PANEL - Abnormal; Notable for the following:    Sodium 128 (*)    Potassium 2.9 (*)    Chloride 89 (*)    Glucose, Bld 154 (*)    Creatinine, Ser 2.46 (*)    Calcium 8.0 (*)    Total Protein 5.9 (*)    Albumin 3.0 (*)    AST 534 (*)    ALT 185 (*)    Total Bilirubin 1.6 (*)    GFR calc non Af Amer 26 (*)    GFR calc Af Amer 30 (*)    All other components within normal limits  CBC WITH DIFFERENTIAL/PLATELET - Abnormal; Notable for the following:    WBC 11.1 (*)    HCT 36.5 (*)    MCHC 36.2 (*)    Platelets 108 (*)    Neutrophils Relative % 82 (*)    Neutro Abs 9.0 (*)    Lymphocytes Relative 10 (*)    All other components within normal limits  CARBOXYHEMOGLOBIN - Abnormal; Notable for the following:    Carboxyhemoglobin 1.8 (*)    All other components within normal limits  CBG MONITORING, ED - Abnormal; Notable for the following:    Glucose-Capillary 155 (*)    All other components within normal limits  I-STAT CHEM 8, ED - Abnormal; Notable for the following:    Sodium 127 (*)    Potassium 2.9 (*)    Chloride 88 (*)    Creatinine, Ser 2.50 (*)    Glucose, Bld 153 (*)    Calcium, Ion 1.00 (*)    All other components within normal limits  I-STAT VENOUS BLOOD GAS, ED - Abnormal; Notable for the following:    pH, Ven 7.449 (*)    pCO2, Ven 38.4 (*)    Bicarbonate 26.6 (*)    Acid-Base Excess 3.0 (*)    All other components within normal limits  URINE CULTURE  ETHANOL  AMMONIA  URINALYSIS, ROUTINE W REFLEX MICROSCOPIC  URINE RAPID DRUG  SCREEN (HOSP PERFORMED)  MAGNESIUM  LACTIC ACID, PLASMA  DIGOXIN LEVEL  URINE RAPID DRUG SCREEN (HOSP PERFORMED)  CREATININE, URINE, RANDOM  OSMOLALITY, URINE  OSMOLALITY  SODIUM, URINE, RANDOM  CBC  CREATININE, SERUM    Imaging Review Ct Head Wo Contrast  08/27/2014   CLINICAL DATA:  History of alcohol withdrawal seizures. Probable seizure last night or this morning. Found unresponsive.  EXAM: CT HEAD WITHOUT CONTRAST  TECHNIQUE: Contiguous axial images were obtained from the base of the skull through the vertex without intravenous contrast.  COMPARISON:  05/10/2005  FINDINGS: The brain has a normal appearance without evidence of atrophy, infarction, mass  lesion, hemorrhage, hydrocephalus or extra-axial collection. The calvarium is unremarkable. The paranasal sinuses, middle ears and mastoids are clear.  IMPRESSION: Normal head CT   Electronically Signed   By: Nelson Chimes M.D.   On: 08/27/2014 08:51   Dg Chest Portable 1 View  08/27/2014   CLINICAL DATA:  CHF.  Seizure this morning.  EXAM: PORTABLE CHEST - 1 VIEW  COMPARISON:  05/10/2005  FINDINGS: The heart size and mediastinal contours are within normal limits. Both lungs are clear. The visualized skeletal structures are unremarkable.  IMPRESSION: No active disease.   Electronically Signed   By: Rolm Baptise M.D.   On: 08/27/2014 08:33     EKG Interpretation   Date/Time:  Friday August 27 2014 08:11:09 EST Ventricular Rate:  84 PR Interval:  178 QRS Duration: 120 QT Interval:  397 QTC Calculation: 469 R Axis:   39 Text Interpretation:  Normal sinus rhythm Non-specific ST-t changes  Although rate has decreased since last tracing Confirmed by RAY MD,  Andee Poles [phone]) on 08/27/2014 8:38:31 AM      MDM   Final diagnoses:  Seizure    Patient is a 64 year old Caucasian male with pertinent past medical history of hypertension and congestive heart failure who comes to the emergency department today after an episode of rigidity  and unresponsiveness this morning. Physical exam as above. Patient does have a laceration to his tongue which combined with his history is concerning for a potential seizure. Patient has had one seizure in the past however this was several years ago and he has not requried treatment for them since then. Upon arrival to the emergency room patient is still lethargic and is not yet at his baseline mental status. His wife reports that he was lethargic all day yesterday as well. Initial workup included a CBC, CBG, alcohol level, i-STAT Chem-8, CMP, CT of the head, chest x-ray, and an EKG. EKG is detailed above. I-STAT Chem-8 demonstrated a sodium of 127, potassium of 2.9, and creatinine of 2.5. Patient was treated with a liter of normal saline and 40 mg potassium to replete his electrolytes. Alcohol level was negative. CT of the head demonstrated no intracranial abnormalities.  CBC with a WBC of 11.1 otherwise unremarkable. Chest x-ray was unremarkable with no consolidations doubt pneumonia. Patient was reevaluated and continued to be lethargic after an hour and a half in the emergency department. As result patient was felt to require admission to the hospital for altered mental status after a seizure with abnormal electrolytes.  Patient also has an elevated AST and ALT.  As result an ammonia level was also obtained. He does take digoxin.  With his continued altered mental status will obtain a digoxin level as well. Patient's care was discussed with medicine and they will admit the patient to their service. They requested that neurology be involved in the emergency room. As result neurology was consulted per their request. Labs and imaging reviewed by myself and considered in medical decision making. Imaging was interpreted by radiology. Care was discussed with my attending Dr. Jeanell Sparrow.      Katheren Shams, MD 08/27/14 [place], MD 08/31/14 [phone]

## 2014-08-27 NOTE — Consult Note (Signed)
NEURO HOSPITALIST CONSULT NOTE    Reason for Consult: seizure  HPI:                                                                                                                                          Logan Thomas is an 64 y.o. male with a past medical history significant for HTN, chronic congestive heart failure, CKD stage 3, alcohol use, alcohol related seizure in 2006, brought in by EMS after sustaining a witnessed seizure this morniNG. Wife is at the bedside and said that around 7 am and while being asleep her husband started making noises that woke her up, and she noted that he was unresponsive, his arms were rigid and shaking upon his chest, eyes and mouth open, with a glazed look in his eyes. She realized that he had bitten his tongue but was not incontinent of urine or stools. The episode went on for few minutes and upon EMS arrival he was confused " and little bit restless and combative". He was still not to his baseline upon arrival to our ED. CT brain reviewed by myself showed no acute abnormality and serologies including ETOH level and UDS were unrevealing. It is worth noting that last night he apparently passed put inside his car at home. No recent fever, infection, sleep deprivation, use of narcotics, benzodiazepines, or illicit drugs. He stated that he resumed drinking vodka 3 or 4 times a week. Patient and wife Informed me that his last drink was 2 days ago. Denies HA, vertigo, double vision, focal weakness or numbness,slurred speech, language or visual impairment. No history of febrile seizures, CNS infection, severe head injury, or stroke. No family history of epilepsy.  Past Medical History  Diagnosis Date  . Hypertension   . CHF (congestive heart failure)   . 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 Essential hypertension   . CHF (congestive heart failure)     No past surgical history on file.  No  family history on file.  Family History: no epilepsy, brain tumors, or brain aneurysms.   Social History:  reports that he has quit smoking. He does not have any smokeless tobacco history on file. He reports that he does not drink alcohol. His drug history is not on file.  No Known Allergies  MEDICATIONS:  Scheduled: . allopurinol  300 mg Oral Daily  . carvedilol  12.5 mg Oral BID WC  . cloNIDine  0.2 mg Oral BID  . digoxin  250 mcg Oral Daily  . folic acid  1 mg Oral Daily  . heparin  5,000 Units Subcutaneous 3 times per day  . lisinopril  20 mg Oral Once  . LORazepam  0-4 mg Intravenous Q6H   Followed by  . [START ON 08/29/2014] LORazepam  0-4 mg Intravenous Q12H  . multivitamin with minerals  1 tablet Oral Daily  . potassium chloride SA  20 mEq Oral Daily  . sodium chloride  3 mL Intravenous Q12H  . thiamine  100 mg Oral Daily   Or  . thiamine  100 mg Intravenous Daily  . traZODone  100 mg Oral QHS     ROS:                                                                                                                                       History obtained from the patient, wife, and chart review  General ROS: negative for - chills, fatigue, fever, night sweats, weight gain or weight loss Psychological ROS: negative for - behavioral disorder, hallucinations, memory difficulties, mood swings or suicidal ideation Ophthalmic ROS: negative for - blurry vision, double vision, eye pain or loss of vision ENT ROS: negative for - epistaxis, nasal discharge, oral lesions, sore throat, tinnitus or vertigo Allergy and Immunology ROS: negative for - hives or itchy/watery eyes Hematological and Lymphatic ROS: negative for - bleeding problems, bruising or swollen lymph nodes Endocrine ROS: negative for - galactorrhea, hair pattern changes, polydipsia/polyuria or temperature  intolerance Respiratory ROS: negative for - cough, hemoptysis, shortness of breath or wheezing Cardiovascular ROS: negative for - chest pain, dyspnea on exertion, edema or irregular heartbeat Gastrointestinal ROS: negative for - abdominal pain, diarrhea, hematemesis, nausea/vomiting or stool incontinence Genito-Urinary ROS: negative for - dysuria, hematuria, incontinence or urinary frequency/urgency Musculoskeletal ROS: negative for - joint swelling or muscular weakness Neurological ROS: as noted in HPI Dermatological ROS: negative for rash and skin lesion changes  Physical exam: pleasant male in no apparent distress. Blood pressure 180/107, pulse 81, temperature 97.8 F (36.6 C), temperature source Oral, resp. rate 21, SpO2 99 %. Head: normocephalic. Neck: supple, no bruits, no JVD. Cardiac: no murmurs. Lungs: clear. Abdomen: soft, no tender, no mass. Extremities: no edema. Skin: no rash Neurologic Examination:  General: Mental Status: Alert, oriented, thought content appropriate.  Speech fluent without evidence of aphasia.  Able to follow 3 step commands without difficulty. Cranial Nerves: II: Discs flat bilaterally; Visual fields grossly normal, pupils equal, round, reactive to light and accommodation III,IV, VI: ptosis not present, extra-ocular motions intact bilaterally V,VII: smile symmetric, facial light touch sensation normal bilaterally VIII: hearing normal bilaterally IX,X: gag reflex present XI: bilateral shoulder shrug XII: midline tongue extension without atrophy or fasciculations  Motor: Right : Upper extremity   5/5    Left:     Upper extremity   5/5  Lower extremity   5/5     Lower extremity   5/5 Tone and bulk:normal tone throughout; no atrophy noted Sensory: Pinprick and light touch intact throughout, bilaterally Deep Tendon Reflexes:  Right: Upper Extremity    Left: Upper extremity   biceps (C-5 to C-6) 2/4   biceps (C-5 to C-6) 2/4 tricep (C7) 2/4    triceps (C7) 2/4 Brachioradialis (C6) 2/4  Brachioradialis (C6) 2/4  Lower Extremity Lower Extremity  quadriceps (L-2 to L-4) 2/4   quadriceps (L-2 to L-4) 2/4 Achilles (S1) 2/4   Achilles (S1) 2/4  Plantars: Right: downgoing   Left: downgoing Cerebellar: normal finger-to-nose,  normal heel-to-shin test Gait:  No tested due to multiple leads and safety reasons. CV: pulses palpable throughout    No results found for: CHOL  Results for orders placed or performed during the hospital encounter of 08/27/14 (from the past 48 hour(s))  Comprehensive metabolic panel     Status: Abnormal   Collection Time: 08/27/14  8:17 AM  Result Value Ref Range   Sodium 128 (L) 135 - 145 mmol/L   Potassium 2.9 (L) 3.5 - 5.1 mmol/L   Chloride 89 (L) 96 - 112 mmol/L   CO2 25 19 - 32 mmol/L   Glucose, Bld 154 (H) 70 - 99 mg/dL   BUN 21 6 - 23 mg/dL   Creatinine, Ser 2.46 (H) 0.50 - 1.35 mg/dL   Calcium 8.0 (L) 8.4 - 10.5 mg/dL   Total Protein 5.9 (L) 6.0 - 8.3 g/dL   Albumin 3.0 (L) 3.5 - 5.2 g/dL   AST 534 (H) 0 - 37 U/L   ALT 185 (H) 0 - 53 U/L   Alkaline Phosphatase 57 39 - 117 U/L   Total Bilirubin 1.6 (H) 0.3 - 1.2 mg/dL   GFR calc non Af Amer 26 (L) >90 mL/min   GFR calc Af Amer 30 (L) >90 mL/min    Comment: (NOTE) The eGFR has been calculated using the CKD EPI equation. This calculation has not been validated in all clinical situations. eGFR's persistently <90 mL/min signify possible Chronic Kidney Disease.    Anion gap 14 5 - 15  CBC with Differential     Status: Abnormal   Collection Time: 08/27/14  8:17 AM  Result Value Ref Range   WBC 11.1 (H) 4.0 - 10.5 K/uL   RBC 4.23 4.22 - 5.81 MIL/uL   Hemoglobin 13.2 13.0 - 17.0 g/dL   HCT 36.5 (L) 39.0 - 52.0 %   MCV 86.3 78.0 - 100.0 fL   MCH 31.2 26.0 - 34.0 pg   MCHC 36.2 (H) 30.0 - 36.0 g/dL   RDW 15.0 11.5 - 15.5 %   Platelets 108 (L)  150 - 400 K/uL    Comment: PLATELET COUNT CONFIRMED BY SMEAR   Neutrophils Relative % 82 (H) 43 - 77 %   Neutro Abs 9.0 (H) 1.7 -  7.7 K/uL   Lymphocytes Relative 10 (L) 12 - 46 %   Lymphs Abs 1.2 0.7 - 4.0 K/uL   Monocytes Relative 8 3 - 12 %   Monocytes Absolute 0.9 0.1 - 1.0 K/uL   Eosinophils Relative 0 0 - 5 %   Eosinophils Absolute 0.0 0.0 - 0.7 K/uL   Basophils Relative 0 0 - 1 %   Basophils Absolute 0.0 0.0 - 0.1 K/uL  POC CBG, ED     Status: Abnormal   Collection Time: 08/27/14  8:20 AM  Result Value Ref Range   Glucose-Capillary 155 (H) 70 - 99 mg/dL   Comment 1 Documented in Chart    Comment 2 Notify RN   Ethanol     Status: None   Collection Time: 08/27/14  8:22 AM  Result Value Ref Range   Alcohol, Ethyl (B) <5 0 - 9 mg/dL    Comment:        LOWEST DETECTABLE LIMIT FOR SERUM ALCOHOL IS 11 mg/dL FOR MEDICAL PURPOSES ONLY   I-stat chem 8, ed     Status: Abnormal   Collection Time: 08/27/14  8:27 AM  Result Value Ref Range   Sodium 127 (L) 135 - 145 mmol/L   Potassium 2.9 (L) 3.5 - 5.1 mmol/L   Chloride 88 (L) 96 - 112 mmol/L   BUN 22 6 - 23 mg/dL   Creatinine, Ser 2.50 (H) 0.50 - 1.35 mg/dL   Glucose, Bld 153 (H) 70 - 99 mg/dL   Calcium, Ion 1.00 (L) 1.13 - 1.30 mmol/L   TCO2 21 0 - 100 mmol/L   Hemoglobin 14.6 13.0 - 17.0 g/dL   HCT 43.0 39.0 - 52.0 %  Ammonia     Status: None   Collection Time: 08/27/14  9:39 AM  Result Value Ref Range   Ammonia 28 11 - 32 umol/L  I-Stat Venous Blood Gas, ED (order at [facility] and [facility] only)     Status: Abnormal   Collection Time: 08/27/14  9:54 AM  Result Value Ref Range   pH, Ven 7.449 (H) 7.250 - 7.300   pCO2, Ven 38.4 (L) 45.0 - 50.0 mmHg   pO2, Ven 44.0 30.0 - 45.0 mmHg   Bicarbonate 26.6 (H) 20.0 - 24.0 mEq/L   TCO2 28 0 - 100 mmol/L   O2 Saturation 81.0 %   Acid-Base Excess 3.0 (H) 0.0 - 2.0 mmol/L   Sample type VENOUS   Carboxyhemoglobin     Status: Abnormal   Collection Time: 08/27/14  9:55 AM  Result Value Ref  Range   Total hemoglobin 17.3 13.5 - 18.0 g/dL   O2 Saturation 79.3 %   Carboxyhemoglobin 1.8 (H) 0.5 - 1.5 %   Methemoglobin 1.0 0.0 - 1.5 %    Ct Head Wo Contrast  08/27/2014   CLINICAL DATA:  History of alcohol withdrawal seizures. Probable seizure last night or this morning. Found unresponsive.  EXAM: CT HEAD WITHOUT CONTRAST  TECHNIQUE: Contiguous axial images were obtained from the base of the skull through the vertex without intravenous contrast.  COMPARISON:  05/10/2005  FINDINGS: The brain has a normal appearance without evidence of atrophy, infarction, mass lesion, hemorrhage, hydrocephalus or extra-axial collection. The calvarium is unremarkable. The paranasal sinuses, middle ears and mastoids are clear.  IMPRESSION: Normal head CT   Electronically Signed   By: Nelson Chimes M.D.   On: 08/27/2014 08:51   Dg Chest Portable 1 View  08/27/2014   CLINICAL DATA:  CHF.  Seizure  this morning.  EXAM: PORTABLE CHEST - 1 VIEW  COMPARISON:  05/10/2005  FINDINGS: The heart size and mediastinal contours are within normal limits. Both lungs are clear. The visualized skeletal structures are unremarkable.  IMPRESSION: No active disease.   Electronically Signed   By: Rolm Baptise M.D.   On: 08/27/2014 08:33   Assessment/Plan: 64 y/o admitted after sustaining a witnessed seizure during sleep. He had a documented alcohol related seizure back in 2006 and reports drinking vodka 3 or 4 times a week, last drink 2 days ago. Non focal neuro-exam and unremarkable CT brain. Alcohol withdrawal seizure still likely. Agree with EEG and MRI brain to complete seizure work up. CIWA protocol. No AED recommended at this point. Will follow up.  Dorian Pod, MD 08/27/2014, 10:44 AM  [facility].

## 2014-08-28 DIAGNOSIS — I5022 Chronic systolic (congestive) heart failure: Secondary | ICD-10-CM | POA: Insufficient documentation

## 2014-08-28 DIAGNOSIS — E876 Hypokalemia: Secondary | ICD-10-CM

## 2014-08-28 LAB — CBC
HCT: 33.9 % — ABNORMAL LOW (ref 39.0–52.0)
HEMOGLOBIN: 12 g/dL — AB (ref 13.0–17.0)
MCH: 31.3 pg (ref 26.0–34.0)
MCHC: 35.4 g/dL (ref 30.0–36.0)
MCV: 88.3 fL (ref 78.0–100.0)
Platelets: 90 10*3/uL — ABNORMAL LOW (ref 150–400)
RBC: 3.84 MIL/uL — ABNORMAL LOW (ref 4.22–5.81)
RDW: 15.7 % — AB (ref 11.5–15.5)
WBC: 8.6 10*3/uL (ref 4.0–10.5)

## 2014-08-28 LAB — BASIC METABOLIC PANEL
Anion gap: 8 (ref 5–15)
BUN: 16 mg/dL (ref 6–23)
CALCIUM: 7.8 mg/dL — AB (ref 8.4–10.5)
CHLORIDE: 100 mmol/L (ref 96–112)
CO2: 24 mmol/L (ref 19–32)
CREATININE: 2.56 mg/dL — AB (ref 0.50–1.35)
GFR calc Af Amer: 29 mL/min — ABNORMAL LOW (ref >90)
GFR, EST NON AFRICAN AMERICAN: 25 mL/min — AB (ref >90)
GLUCOSE: 140 mg/dL — AB (ref 70–99)
Potassium: 3.1 mmol/L — ABNORMAL LOW (ref 3.5–5.1)
SODIUM: 132 mmol/L — AB (ref 135–145)

## 2014-08-28 LAB — LACTIC ACID, PLASMA: Lactic Acid, Venous: 1.4 mmol/L (ref 0.5–2.0)

## 2014-08-28 MED ORDER — POTASSIUM CHLORIDE CRYS ER 20 MEQ PO TBCR
40.0000 meq | EXTENDED_RELEASE_TABLET | Freq: Four times a day (QID) | ORAL | Status: AC
  Administered 2014-08-28 (×2): 40 meq via ORAL
  Filled 2014-08-28 (×2): qty 2

## 2014-08-28 MED ORDER — POTASSIUM CHLORIDE 10 MEQ/100ML IV SOLN
10.0000 meq | INTRAVENOUS | Status: AC
  Administered 2014-08-28 (×2): 10 meq via INTRAVENOUS
  Filled 2014-08-28 (×2): qty 100

## 2014-08-28 NOTE — Progress Notes (Signed)
[facility] PROGRESS NOTE  Logan Thomas [id number] DOB: 02-14-1951 DOA: 08/27/2014 PCP: Leola Brazil, MD  Assessment/Plan: 1. New onset seizures -Patient having unremarkable workup during this hospitalization which has included CT scanning and MRI of brain. EEG did not reveal epileptiform activity. His exam has been nonfocal. -Case discussed with neurology who did not recommend starting antiepileptic therapy -It was suspected that seizure activity could be related to alcohol  2.  Hypokalemia -A.m. labs showing potassium of 3.1 -Will replace potassium today, repeat potassium level in am  3.  Chronic systolic congestive heart failure -Currently compensated, he does not appear to have clinical signs or symptoms suggestive of acute CHF. -Transthoracic echocardiogram performed on 08/27/2014 showing ejection fraction 40-45% with diffuse hypokinesis and grade 1 diastolic dysfunction  4.  Essential hypertension. -Blood pressure stable with last blood pressure reading of 127/68 -Continue hydralazine 50 mg by mouth every 8 hours, clonidine 0.2 mg by mouth 3 times a day and Coreg 12.5 mg by mouth twice a day  Code Status: full code Family Communication: Spoke with family members present at bedside  Disposition Plan: anticipate discharge home in the next 24 hours   Consultants:  Neurology   HPI/Subjective: Patient is a 64 year old with a past medical history of hypertension, stage IV chronic disease, chronic systolic congestive heart failure, admitted to the medicine service on 08/27/2014 after having seizure-like episode after which he became unresponsive on the morning of admission. Initial workup included a CT scan of brain which did not show acute intracranial changes. Patient was seen and evaluated by neurology. Overnight he did not have any further seizure activity. He had an MRI of brain which did not show acute intracranial changes or lesions that could explain  seizures. His EEG was unremarkable.  Objective: Filed Vitals:   08/28/14 0957  BP: 127/68  Pulse: 76  Temp: 98.4 F (36.9 C)  Resp: 18    Intake/Output Summary (Last 24 hours) at 08/28/14 1359 Last data filed at 08/27/14 1721  Gross per 24 hour  Intake      0 ml  Output    500 ml  Net   -500 ml   Filed Weights   08/28/14 0500  Weight: 84.1 kg (185 lb 6.5 oz)    Exam:   General:  Patient is awake, alert, following commands  Cardiovascular: regular rate and rhythm normal S1-S2  Respiratory: clear to auscultation bilaterally no wheezing rhonchi or rales  Abdomen: soft nontender nondistended positive bowel sounds  Musculoskeletal: no edema  Neurological: Nonfocal neurologic exam  Data Reviewed: Basic Metabolic Panel:  Recent Labs Lab 08/27/14 0817 08/27/14 0827 08/27/14 0939 08/27/14 1547 08/28/14 0551  NA 128* 127*  --   --  132*  K 2.9* 2.9*  --  3.0* 3.1*  CL 89* 88*  --   --  100  CO2 25  --   --   --  24  GLUCOSE 154* 153*  --   --  140*  BUN 21 22  --   --  16  CREATININE 2.46* 2.50*  --   --  2.56*  CALCIUM 8.0*  --   --   --  7.8*  MG  --   --  1.8  --   --    Liver Function Tests:  Recent Labs Lab 08/27/14 0817  AST 534*  ALT 185*  ALKPHOS 57  BILITOT 1.6*  PROT 5.9*  ALBUMIN 3.0*   No results for input(s): LIPASE, AMYLASE in  the last 168 hours.  Recent Labs Lab 08/27/14 0939  AMMONIA 28   CBC:  Recent Labs Lab 08/27/14 0817 08/27/14 0827 08/28/14 0551  WBC 11.1*  --  8.6  NEUTROABS 9.0*  --   --   HGB 13.2 14.6 12.0*  HCT 36.5* 43.0 33.9*  MCV 86.3  --  88.3  PLT 108*  --  90*   Cardiac Enzymes: No results for input(s): CKTOTAL, CKMB, CKMBINDEX, TROPONINI in the last 168 hours. BNP (last 3 results) No results for input(s): BNP in the last 8760 hours.  ProBNP (last 3 results) No results for input(s): PROBNP in the last 8760 hours.  CBG:  Recent Labs Lab 08/27/14 0820  GLUCAP 155*    No results found for  this or any previous visit (from the past 240 hour(s)).   Studies: Ct Head Wo Contrast  08/27/2014   CLINICAL DATA:  History of alcohol withdrawal seizures. Probable seizure last night or this morning. Found unresponsive.  EXAM: CT HEAD WITHOUT CONTRAST  TECHNIQUE: Contiguous axial images were obtained from the base of the skull through the vertex without intravenous contrast.  COMPARISON:  05/10/2005  FINDINGS: The brain has a normal appearance without evidence of atrophy, infarction, mass lesion, hemorrhage, hydrocephalus or extra-axial collection. The calvarium is unremarkable. The paranasal sinuses, middle ears and mastoids are clear.  IMPRESSION: Normal head CT   Electronically Signed   By: Nelson Chimes M.D.   On: 08/27/2014 08:51   Mr Brain Wo Contrast  08/27/2014   CLINICAL DATA:  Seizure  EXAM: MRI HEAD WITHOUT CONTRAST  TECHNIQUE: Multiplanar, multiecho pulse sequences of the brain and surrounding structures were obtained without intravenous contrast.  COMPARISON:  05/11/2005  FINDINGS: Motion degraded, but overall diagnostic imaging.  Calvarium and upper cervical spine: No marrow signal abnormality.  Orbits: No significant findings.  Sinuses: The paranasal sinuses are clear. Under aerated temporal bones with likely chronic fluid or mucosal thickening in the right mastoid tip.  Brain: No acute abnormality such as acute infarct, hemorrhage, hydrocephalus, or mass lesion. No evidence of large vessel occlusion. There is confluent T2 and FLAIR hyperintensity around the lateral ventricles, with surrounding patchy signal abnormality. This is most likely from chronic small vessel disease in this patient with history of hypertension and chronic kidney disease. The white matter disease has significantly progressed from 2006.  Intermittent FLAIR hyperintensity within cerebral sulci is not convincing for pathology.  IMPRESSION: 1. No infarct or other acute intracranial finding. 2. Moderate small-vessel disease,  progressed since 2006.   Electronically Signed   By: Jorje Guild M.D.   On: 08/27/2014 17:17   Dg Chest Portable 1 View  08/27/2014   CLINICAL DATA:  CHF.  Seizure this morning.  EXAM: PORTABLE CHEST - 1 VIEW  COMPARISON:  05/10/2005  FINDINGS: The heart size and mediastinal contours are within normal limits. Both lungs are clear. The visualized skeletal structures are unremarkable.  IMPRESSION: No active disease.   Electronically Signed   By: Rolm Baptise M.D.   On: 08/27/2014 08:33    Scheduled Meds: . allopurinol  300 mg Oral Daily  . antiseptic oral rinse  7 mL Mouth Rinse BID  . carvedilol  12.5 mg Oral BID WC  . cloNIDine  0.2 mg Oral TID  . digoxin  250 mcg Oral Daily  . folic acid  1 mg Oral Daily  . heparin  5,000 Units Subcutaneous 3 times per day  . hydrALAZINE  50 mg Oral 3 times  per day  . LORazepam  0-4 mg Intravenous Q6H   Followed by  . [START ON 08/29/2014] LORazepam  0-4 mg Intravenous Q12H  . multivitamin with minerals  1 tablet Oral Daily  . potassium chloride  10 mEq Intravenous Q1 Hr x 2  . potassium chloride SA  20 mEq Oral Daily  . potassium chloride  40 mEq Oral Q6H  . sodium chloride  3 mL Intravenous Q12H  . thiamine  100 mg Oral Daily  . 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 If 7PM-7AM, please contact night-coverage at [email or web address], password [facility] 08/28/2014, 1:59 PM  LOS: 1 day

## 2014-08-28 NOTE — Progress Notes (Signed)
Physical Therapy Evaluation Patient Details Name: Logan Thomas MRN: [id number] DOB: [birth year] Today's Date: 08/28/2014   History of Present Illness  Patient is a 64 yo male admitted 08/27/14 with seizures, AMS, and HTN.  Patient on alcohol withdrawal precautions.  MRI and EEG negative per chart. Per MD note, seizure poss due to alcohol withdrawal.   PMH:  HTN, CHF, CKD, ETOH abuse, seizure.  Clinical Impression  Patient presents with problems listed below.  Will benefit from acute PT to maximize independence and safety prior to discharge home with wife.  Will complete stair training at next session.    Follow Up Recommendations No PT follow up;Supervision - Intermittent    Equipment Recommendations  Rolling walker with 5" wheels    Recommendations for Other Services       Precautions / Restrictions Precautions Precautions: Fall Restrictions Weight Bearing Restrictions: No      Mobility  Bed Mobility Overal bed mobility: Modified Independent             General bed mobility comments: Increased time  Transfers Overall transfer level: Needs assistance Equipment used: None Transfers: Sit to/from Stand Sit to Stand: Supervision         General transfer comment: Assist for safety/balance  Ambulation/Gait Ambulation/Gait assistance: Min guard Ambulation Distance (Feet): 84 Feet Assistive device: None Gait Pattern/deviations: Step-through pattern;Decreased stride length;Shuffle;Trunk flexed Gait velocity: Decreased Gait velocity interpretation: Below normal speed for age/gender General Gait Details: Patient with slow gait speed.  Reports calf muscles feel like they are cramping during gait, causing patient to shuffle during gait.  Slightly unsteady with gait.  Stairs            Wheelchair Mobility    Modified Rankin (Stroke Patients Only)       Balance                                             Pertinent Vitals/Pain Pain  Assessment: Faces Faces Pain Scale: Hurts little more Pain Location: Bilateral calf cramping during gait Pain Descriptors / Indicators: Cramping Pain Intervention(s): Limited activity within patient's tolerance;Repositioned    Home Living Family/patient expects to be discharged to:: Private residence Living Arrangements: Spouse/significant other Available Help at Discharge: Family;Available PRN/intermittently Type of Home: House Home Access: Stairs to enter Entrance Stairs-Rails: Right Entrance Stairs-Number of Steps: 4 Home Layout: Two level;Bed/bath upstairs Home Equipment: None      Prior Function Level of Independence: Independent               Hand Dominance        Extremity/Trunk Assessment   Upper Extremity Assessment: Overall WFL for tasks assessed           Lower Extremity Assessment: Overall WFL for tasks assessed      Cervical / Trunk Assessment: Normal  Communication   Communication: No difficulties  Cognition Arousal/Alertness: Awake/alert Behavior During Therapy: WFL for tasks assessed/performed Overall Cognitive Status: Within Functional Limits for tasks assessed (Oriented, answers questions appropriately)                      General Comments      Exercises        Assessment/Plan    PT Assessment Patient needs continued PT services  PT Diagnosis Abnormality of gait;Acute pain   PT Problem List Decreased activity tolerance;Decreased balance;Decreased mobility;Decreased knowledge of  use of DME;Pain  PT Treatment Interventions DME instruction;Gait training;Stair training;Functional mobility training;Therapeutic activities;Balance training;Patient/family education   PT Goals (Current goals can be found in the Care Plan section) Acute Rehab PT Goals Patient Stated Goal: None stated PT Goal Formulation: With patient/family Time For Goal Achievement: 09/04/14 Potential to Achieve Goals: Good    Frequency Min 3X/week    Barriers to discharge Decreased caregiver support Patient alone while wife is at work.    Co-evaluation               End of Session Equipment Utilized During Treatment: Gait belt Activity Tolerance: Patient limited by fatigue;Patient limited by pain Patient left: in bed;with call bell/phone within reach;with bed alarm set;with family/visitor present Nurse Communication: Mobility status (Encouraged ambulation with assist in hallway)         Time: [id number] PT Time Calculation (min) (ACUTE ONLY): 24 min   Charges:   PT Evaluation $Initial PT Evaluation Tier I: 1 Procedure PT Treatments $Gait Training: 8-22 mins   PT G CodesDespina Pole 09/27/2014, 5:26 PM Carita Pian. Sanjuana Kava, [place] Pager [phone]

## 2014-08-28 NOTE — Progress Notes (Signed)
NEURO HOSPITALIST PROGRESS NOTE   SUBJECTIVE:                                                                                                                        Uneventful night. Offers no neurological complains. No further seizures noted. MRI brain reviewed by myself showed no acute abnormality or any lesions that could explain his seizure. EEG unremarkable.  OBJECTIVE:                                                                                                                           Vital signs in last 24 hours: Temp:  [97.8 F (36.6 C)-99.3 F (37.4 C)] 98.5 F (36.9 C) (02/06 0539) Pulse Rate:  [64-94] 71 (02/06 0823) Resp:  [18-25] 18 (02/06 0823) BP: (141-195)/(82-109) 170/96 mmHg (02/06 0823) SpO2:  [98 %-100 %] 99 % (02/06 0823) Weight:  [84.1 kg (185 lb 6.5 oz)] 84.1 kg (185 lb 6.5 oz) (02/06 0500)  Intake/Output from previous day: 02/05 0701 - 02/06 0700 In: -  Out: 500 [Urine:500] Intake/Output this shift:   Nutritional status: Diet full liquid  Past Medical History  Diagnosis Date  . Hypertension   . CHF (congestive heart failure)   . 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 Essential hypertension   . CHF (congestive heart failure)    Physical exam: pleasant male in no apparent distress. Blood pressure 180/107, pulse 81, temperature 97.8 F (36.6 C), temperature source Oral, resp. rate 21, SpO2 99 %. Head: normocephalic. Neck: supple, no bruits, no JVD. Cardiac: no murmurs. Lungs: clear. Abdomen: soft, no tender, no mass. Extremities: no edema. Skin: no rash  Neurologic Exam:  General: Mental Status: Alert, oriented, thought content appropriate. Speech fluent without evidence of aphasia. Able to follow 3 step commands without difficulty. Cranial Nerves: II: Discs flat bilaterally; Visual fields grossly normal, pupils equal, round, reactive to light and  accommodation III,IV, VI: ptosis not present, extra-ocular motions intact bilaterally V,VII: smile symmetric, facial light touch sensation normal bilaterally VIII: hearing normal bilaterally IX,X: gag reflex present XI: bilateral shoulder shrug XII: midline tongue extension without atrophy or fasciculations  Motor: Right :Upper extremity 5/5Left: Upper extremity  5/5 Lower extremity 5/5Lower extremity 5/5 Tone and bulk:normal tone throughout; no atrophy noted Sensory: Pinprick and light touch intact throughout, bilaterally Deep Tendon Reflexes:  Right: Upper Extremity Left: Upper extremity   biceps (C-5 to C-6) 2/4 biceps (C-5 to C-6) 2/4 tricep (C7) 2/4triceps (C7) 2/4 Brachioradialis (C6) 2/4Brachioradialis (C6) 2/4  Lower Extremity Lower Extremity  quadriceps (L-2 to L-4) 2/4 quadriceps (L-2 to L-4) 2/4 Achilles (S1) 2/4Achilles (S1) 2/4  Plantars: Right: downgoingLeft: downgoing Cerebellar: normal finger-to-nose, normal heel-to-shin test Gait:  No tested due to multiple leads and safety reasons. CV: pulses palpable throughout   Lab Results: No results found for: CHOL Lipid Panel No results for input(s): CHOL, TRIG, HDL, CHOLHDL, VLDL, LDLCALC in the last 72 hours.  Studies/Results: Ct Head Wo Contrast  08/27/2014   CLINICAL DATA:  History of alcohol withdrawal seizures. Probable seizure last night or this morning. Found unresponsive.  EXAM: CT HEAD WITHOUT CONTRAST  TECHNIQUE: Contiguous axial images were obtained from the base of the skull through the vertex without intravenous contrast.  COMPARISON:  05/10/2005  FINDINGS: The brain has a normal appearance without evidence of  atrophy, infarction, mass lesion, hemorrhage, hydrocephalus or extra-axial collection. The calvarium is unremarkable. The paranasal sinuses, middle ears and mastoids are clear.  IMPRESSION: Normal head CT   Electronically Signed   By: Nelson Chimes M.D.   On: 08/27/2014 08:51   Mr Brain Wo Contrast  08/27/2014   CLINICAL DATA:  Seizure  EXAM: MRI HEAD WITHOUT CONTRAST  TECHNIQUE: Multiplanar, multiecho pulse sequences of the brain and surrounding structures were obtained without intravenous contrast.  COMPARISON:  05/11/2005  FINDINGS: Motion degraded, but overall diagnostic imaging.  Calvarium and upper cervical spine: No marrow signal abnormality.  Orbits: No significant findings.  Sinuses: The paranasal sinuses are clear. Under aerated temporal bones with likely chronic fluid or mucosal thickening in the right mastoid tip.  Brain: No acute abnormality such as acute infarct, hemorrhage, hydrocephalus, or mass lesion. No evidence of large vessel occlusion. There is confluent T2 and FLAIR hyperintensity around the lateral ventricles, with surrounding patchy signal abnormality. This is most likely from chronic small vessel disease in this patient with history of hypertension and chronic kidney disease. The white matter disease has significantly progressed from 2006.  Intermittent FLAIR hyperintensity within cerebral sulci is not convincing for pathology.  IMPRESSION: 1. No infarct or other acute intracranial finding. 2. Moderate small-vessel disease, progressed since 2006.   Electronically Signed   By: Jorje Guild M.D.   On: 08/27/2014 17:17   Dg Chest Portable 1 View  08/27/2014   CLINICAL DATA:  CHF.  Seizure this morning.  EXAM: PORTABLE CHEST - 1 VIEW  COMPARISON:  05/10/2005  FINDINGS: The heart size and mediastinal contours are within normal limits. Both lungs are clear. The visualized skeletal structures are unremarkable.  IMPRESSION: No active disease.   Electronically Signed   By: Rolm Baptise M.D.    On: 08/27/2014 08:33    MEDICATIONS  Scheduled: . allopurinol  300 mg Oral Daily  . antiseptic oral rinse  7 mL Mouth Rinse BID  . carvedilol  12.5 mg Oral BID WC  . cloNIDine  0.2 mg Oral TID  . digoxin  250 mcg Oral Daily  . folic acid  1 mg Oral Daily  . heparin  5,000 Units Subcutaneous 3 times per day  . hydrALAZINE  50 mg Oral 3 times per day  . LORazepam  0-4 mg Intravenous Q6H   Followed by  . [START ON 08/29/2014] LORazepam  0-4 mg Intravenous Q12H  . multivitamin with minerals  1 tablet Oral Daily  . potassium chloride SA  20 mEq Oral Daily  . potassium chloride  40 mEq Oral Q6H  . sodium chloride  3 mL Intravenous Q12H  . thiamine  100 mg Oral Daily  . traZODone  100 mg Oral QHS    ASSESSMENT/PLAN:                                                                                                           64 y/o admitted after sustaining a witnessed seizure during sleep. He had a documented alcohol related seizure back in 2006 and reports drinking vodka 3 or 4 times a week, last drink 2 days ago. Non focal neuro-exam and unremarkable CT and MRI brain. EEG Most likely  alcohol withdrawal seizure, hence no AED recommended. Advised enrolling in an alcohol cessation program. No further neurological intervention needed at this time. Will sign off.  Dorian Pod, MD [facility] [phone]  08/28/2014, 9:00 AM

## 2014-08-29 LAB — BASIC METABOLIC PANEL
ANION GAP: 6 (ref 5–15)
BUN: 16 mg/dL (ref 6–23)
CO2: 25 mmol/L (ref 19–32)
Calcium: 8.1 mg/dL — ABNORMAL LOW (ref 8.4–10.5)
Chloride: 106 mmol/L (ref 96–112)
Creatinine, Ser: 2.49 mg/dL — ABNORMAL HIGH (ref 0.50–1.35)
GFR calc Af Amer: 30 mL/min — ABNORMAL LOW (ref >90)
GFR calc non Af Amer: 26 mL/min — ABNORMAL LOW (ref >90)
Glucose, Bld: 105 mg/dL — ABNORMAL HIGH (ref 70–99)
Potassium: 3.7 mmol/L (ref 3.5–5.1)
SODIUM: 137 mmol/L (ref 135–145)

## 2014-08-29 LAB — URINE CULTURE: Colony Count: 8000

## 2014-08-29 MED ORDER — HYDROCODONE-ACETAMINOPHEN 5-325 MG PO TABS: 1.0000 | ORAL_TABLET | ORAL | Status: DC | PRN

## 2014-08-29 NOTE — Progress Notes (Signed)
Patient Logan Thomas home via car with family.  DC instructions given to patient and fully understood.  Vital signs and assessments were stable.

## 2014-08-29 NOTE — Progress Notes (Signed)
Physical Therapy Treatment Patient Details Name: Logan Thomas MRN: [id number] DOB: 17-Jun-1951 Today's Date: 2014-09-12    History of Present Illness Patient is a 64 yo male admitted 08/27/14 with seizures, AMS, and HTN.  Patient on alcohol withdrawal precautions.  MRI and EEG negative per chart. Per MD note, seizure poss due to alcohol withdrawal.   PMH:  HTN, CHF, CKD, ETOH abuse, seizure.    PT Comments    Patient doing well with mobility.  Able to ambulate with good balance with no assistive device.  Able to negotiate stairs with supervision.  Encouraged ambulation with assist.  Follow Up Recommendations  No PT follow up;Supervision - Intermittent     Equipment Recommendations  None recommended by PT    Recommendations for Other Services       Precautions / Restrictions Precautions Precautions: Fall Restrictions Weight Bearing Restrictions: No    Mobility  Bed Mobility Overal bed mobility: Modified Independent             General bed mobility comments: Increased time  Transfers Overall transfer level: Needs assistance Equipment used: None Transfers: Sit to/from Stand Sit to Stand: Supervision         General transfer comment: Assist for safety/balance  Ambulation/Gait Ambulation/Gait assistance: Supervision Ambulation Distance (Feet): 250 Feet Assistive device: None Gait Pattern/deviations: Step-through pattern;Decreased step length - right;Decreased step length - left;Shuffle Gait velocity: Decreased Gait velocity interpretation: Below normal speed for age/gender General Gait Details: Patient with slow, steady gait.  Good balance without assistive device.   Stairs Stairs: Yes Stairs assistance: Supervision Stair Management: One rail Right;Alternating pattern;Step to pattern;Forwards Number of Stairs: 10 (5 x2) General stair comments: Instructed patient on step-to pattern and to hold on to rail for increased safety..  Wheelchair Mobility     Modified Rankin (Stroke Patients Only)       Balance                                    Cognition Arousal/Alertness: Awake/alert Behavior During Therapy: WFL for tasks assessed/performed Overall Cognitive Status: Within Functional Limits for tasks assessed                      Exercises      General Comments        Pertinent Vitals/Pain Pain Assessment: No/denies pain    Home Living                      Prior Function            PT Goals (current goals can now be found in the care plan section) Progress towards PT goals: Progressing toward goals    Frequency  Min 3X/week    PT Plan Current plan remains appropriate;Equipment recommendations need to be updated    Co-evaluation             End of Session Equipment Utilized During Treatment: Gait belt Activity Tolerance: Patient tolerated treatment well Patient left: in bed;with call bell/phone within reach;with family/visitor present     Time: [id number] PT Time Calculation (min) (ACUTE ONLY): 12 min  Charges:  $Gait Training: 8-22 mins                    G Codes:      Despina Pole September 12, 2014, 9:46 AM Carita Pian. Sanjuana Kava, [place] Pager [phone]

## 2014-08-29 NOTE — Discharge Summary (Signed)
Physician Discharge Summary  Logan Thomas [id number] DOB: 1950/08/31 DOA: 08/27/2014  PCP: Leola Brazil, MD  Admit date: 08/27/2014 Discharge date: 08/29/2014  Time spent: 35 minutes  Recommendations for Outpatient Follow-up:  1. Patient admitted for seizures felt to be alcohol related, he was instructed not to drive 2. Please follow-up on patient's blood pressures 3. 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 male, with history of essential hypertension, chronic and it disease stage IV Baseline creatinine not in system, chronic congestive heart failure unclear type no echo in the chart, history of alcohol abuse supposedly much improved now and drinking only 2-3 times a week for the last 1 month, not very forthcoming with information. History of seizure several years ago thought to be secondary to alcohol withdrawal.  Patient comes in after having a seizure-like episode this morning, he also had a passing out spell while sitting in his car yesterday, currently denies any fever or chills or headache, no head injuries, no history of stroke, gives inconsistent alcohol intake history, no chest or abdominal pain, no coughing shortness of breath, no exposure to sick contacts. No focal weakness.  In the ER lab work suggestive of chronic kidney disease stage IV, he is hyponatremic with hypokalemia, head CT unremarkable. Neuro was consulted and I was requested to admit the patient for seizures.  Hospital Course:  Patient  is a 64 year old with a past medical history of hypertension, stage IV chronic disease, chronic systolic congestive heart failure, admitted to the medicine service on 08/27/2014 after having seizure-like episode after which he became unresponsive on the morning of admission. Initial workup included a CT scan of brain which did not show acute intracranial changes. Patient was seen and evaluated by neurology. Overnight he did not have any further seizure activity. He had an MRI of brain which did not show acute intracranial changes or lesions that could explain seizures. His EEG was unremarkable. Case discussed with neurology as seizures were felt to be alcohol related. Anticonvulsant therapy was not recommended. Other issues addressed during this hospitalization included hypokalemia which was replaced. Lab work on day of discharge showed improved potassium of 3.7. Patient was discharged to his home in stable condition on 08/29/2014.   Consultations:  Neurology  Discharge Exam: Filed Vitals:   08/29/14 1006  BP: 173/89  Pulse: 66  Temp: 98.4 F (36.9 C)  Resp: 20    General: Patient is awake, alert, no acute distress. Reports feeling better, ambulated around his room today without any issues. Cardiovascular: Regular rate and rhythm normal S1-S2 Respiratory: Normal respiratory effort, lungs clear to auscultation bilaterally Abdomen: Soft nontender nondistended   Discharge Instructions   Discharge Instructions    Call MD for:  difficulty breathing, headache or visual disturbances    Complete by:  As directed      Call MD for:  extreme fatigue    Complete by:  As directed      Call MD for:  hives  Complete by:  As directed      Call MD for:  persistant dizziness or light-headedness    Complete by:  As directed      Call MD for:  persistant nausea and vomiting    Complete by:  As directed      Call MD for:  redness, tenderness, or signs of infection (pain, swelling, redness, odor or  green/yellow discharge around incision site)    Complete by:  As directed      Call MD for:  severe uncontrolled pain    Complete by:  As directed      Call MD for:  temperature >100.4    Complete by:  As directed      Diet - low sodium heart healthy    Complete by:  As directed      Discharge instructions    Complete by:  As directed   Do not drive until cleared by your physician     Increase activity slowly    Complete by:  As directed           Current Discharge Medication List    START taking these medications   Details  HYDROcodone-acetaminophen (NORCO/VICODIN) 5-325 MG per tablet Take 1-2 tablets by mouth every 4 (four) hours as needed for moderate pain. Qty: 30 tablet, Refills: 0      CONTINUE these medications which have NOT CHANGED   Details  allopurinol (ZYLOPRIM) 300 MG tablet Take 300 mg by mouth daily.    ALPRAZolam (XANAX) 0.25 MG tablet Take 0.25 mg by mouth 2 (two) times daily.    carvedilol (COREG) 12.5 MG tablet Take 12.5 mg by mouth 2 (two) times daily.    cloNIDine (CATAPRES) 0.2 MG tablet Take 0.2 mg by mouth 2 (two) times daily.    digoxin (LANOXIN) 0.25 MG tablet Take 250 mcg by mouth daily.     furosemide (LASIX) 40 MG tablet Take 40 mg by mouth 2 (two) times daily.    KLOR-CON M20 20 MEQ tablet Take 20 mEq by mouth daily.    lisinopril (PRINIVIL,ZESTRIL) 20 MG tablet Take 20 mg by mouth daily.    Multiple Vitamins-Minerals (CENTRUM SILVER ADULT 50+) TABS Take 1 tablet by mouth daily.    traZODone (DESYREL) 100 MG tablet Take 100 mg by mouth at bedtime.       No Known Allergies Follow-up Information    Follow up with KILPATRICK JR,GEORGE R, MD In 2 weeks.   Specialty:  Pulmonary Disease   Contact information:   [place] [address] [phone]        The results of significant diagnostics from this hospitalization (including imaging, microbiology, ancillary and laboratory) are listed below for reference.     Significant Diagnostic Studies: Ct Head Wo Contrast  08/27/2014   CLINICAL DATA:  History of alcohol withdrawal seizures. Probable seizure last night or this morning. Found unresponsive.  EXAM: CT HEAD WITHOUT CONTRAST  TECHNIQUE: Contiguous axial images were obtained from the base of the skull through the vertex without intravenous contrast.  COMPARISON:  05/10/2005  FINDINGS: The brain has a normal appearance without evidence of atrophy, infarction, mass lesion, hemorrhage, hydrocephalus or extra-axial collection. The calvarium is unremarkable. The paranasal sinuses, middle ears and mastoids are clear.  IMPRESSION: Normal head CT   Electronically Signed   By: Nelson Chimes M.D.   On: 08/27/2014 08:51   Mr Brain Wo Contrast  08/27/2014   CLINICAL DATA:  Seizure  EXAM: MRI  HEAD WITHOUT CONTRAST  TECHNIQUE: Multiplanar, multiecho pulse sequences of the brain and surrounding structures were obtained without intravenous contrast.  COMPARISON:  05/11/2005  FINDINGS: Motion degraded, but overall diagnostic imaging.  Calvarium and upper cervical spine: No marrow signal abnormality.  Orbits: No significant findings.  Sinuses: The paranasal sinuses are clear. Under aerated temporal bones with likely chronic fluid or mucosal thickening in the right mastoid tip.  Brain: No acute abnormality such as acute infarct, hemorrhage, hydrocephalus, or mass lesion. No evidence of large vessel occlusion. There is confluent T2 and FLAIR hyperintensity around the lateral ventricles, with surrounding patchy signal abnormality. This is most likely from chronic small vessel disease in this patient with history of hypertension and chronic kidney disease. The white matter disease has significantly progressed from 2006.  Intermittent FLAIR hyperintensity within cerebral sulci is not convincing for pathology.  IMPRESSION: 1. No infarct or other acute intracranial finding. 2. Moderate small-vessel disease, progressed since 2006.    Electronically Signed   By: Jorje Guild M.D.   On: 08/27/2014 17:17   Dg Chest Portable 1 View  08/27/2014   CLINICAL DATA:  CHF.  Seizure this morning.  EXAM: PORTABLE CHEST - 1 VIEW  COMPARISON:  05/10/2005  FINDINGS: The heart size and mediastinal contours are within normal limits. Both lungs are clear. The visualized skeletal structures are unremarkable.  IMPRESSION: No active disease.   Electronically Signed   By: Rolm Baptise M.D.   On: 08/27/2014 08:33    Microbiology: Recent Results (from the past 240 hour(s))  Urine culture     Status: None   Collection Time: 08/27/14  2:30 PM  Result Value Ref Range Status   Specimen Description URINE, CLEAN CATCH  Final   Special Requests NONE  Final   Colony Count   Final    8,000 COLONIES/ML Performed at [facility]    Culture   Final    INSIGNIFICANT GROWTH Performed at [facility]    Report Status 08/29/2014 FINAL  Final     Labs: Basic Metabolic Panel:  Recent Labs Lab 08/27/14 [id number] 08/27/14 0827 08/27/14 0939 08/27/14 1547 08/28/14 0551 08/29/14 0620  NA 128* 127*  --   --  132* 137  K 2.9* 2.9*  --  3.0* 3.1* 3.7  CL 89* 88*  --   --  100 106  CO2 25  --   --   --  24 25  GLUCOSE 154* 153*  --   --  140* 105*  BUN 21 22  --   --  16 16  CREATININE 2.46* 2.50*  --   --  2.56* 2.49*  CALCIUM 8.0*  --   --   --  7.8* 8.1*  MG  --   --  1.8  --   --   --    Liver Function Tests:  Recent Labs Lab 08/27/14 0817  AST 534*  ALT 185*  ALKPHOS 57  BILITOT 1.6*  PROT 5.9*  ALBUMIN 3.0*   No results for input(s): LIPASE, AMYLASE in the last 168 hours.  Recent Labs Lab 08/27/14 0939  AMMONIA 28   CBC:  Recent Labs Lab 08/27/14 0817 08/27/14 0827 08/28/14 0551  WBC 11.1*  --  8.6  NEUTROABS 9.0*  --   --   HGB 13.2 14.6 12.0*  HCT 36.5* 43.0 33.9*  MCV 86.3  --  88.3  PLT 108*  --  90*   Cardiac Enzymes: No results for input(s): CKTOTAL,  CKMB, CKMBINDEX, TROPONINI in the last 168  hours. BNP: BNP (last 3 results) No results for input(s): BNP in the last 8760 hours.  ProBNP (last 3 results) No results for input(s): PROBNP in the last 8760 hours.  CBG:  Recent Labs Lab 08/27/14 0820  GLUCAP 155*       Signed:  Kelvin Cellar  [facility] 08/29/2014, 1:18 PM

## 2015-01-17 ENCOUNTER — Other Ambulatory Visit: Payer: Self-pay | Admitting: Nephrology

## 2015-01-17 DIAGNOSIS — N184 Chronic kidney disease, stage 4 (severe): Secondary | ICD-10-CM

## 2015-01-28 ENCOUNTER — Ambulatory Visit
Admission: RE | Admit: 2015-01-28 | Discharge: 2015-01-28 | Disposition: A | Discharge status: Home / Self Care | Payer: Medicare Other | Source: Ambulatory Visit | Attending: Nephrology | Admitting: Nephrology

## 2015-01-28 DIAGNOSIS — N184 Chronic kidney disease, stage 4 (severe): Secondary | ICD-10-CM

## 2018-01-08 ENCOUNTER — Other Ambulatory Visit: Payer: Self-pay

## 2018-01-08 ENCOUNTER — Ambulatory Visit (HOSPITAL_COMMUNITY)
Admission: RE | Disposition: A | Discharge status: Home / Self Care | Payer: Self-pay | Source: Ambulatory Visit | Attending: Cardiovascular Disease

## 2018-01-08 ENCOUNTER — Encounter (HOSPITAL_COMMUNITY): Payer: Self-pay

## 2018-01-08 ENCOUNTER — Observation Stay (HOSPITAL_COMMUNITY)
Admission: RE | Admit: 2018-01-08 | Discharge: 2018-01-09 | Disposition: A | Discharge status: Home / Self Care | Payer: Medicare Other | Source: Ambulatory Visit | Attending: Cardiovascular Disease | Admitting: Cardiovascular Disease

## 2018-01-08 ENCOUNTER — Ambulatory Visit (HOSPITAL_COMMUNITY): Payer: Medicare Other

## 2018-01-08 DIAGNOSIS — R0602 Shortness of breath: Secondary | ICD-10-CM | POA: Insufficient documentation

## 2018-01-08 DIAGNOSIS — Z79899 Other long term (current) drug therapy: Secondary | ICD-10-CM | POA: Diagnosis not present

## 2018-01-08 DIAGNOSIS — I2510 Atherosclerotic heart disease of native coronary artery without angina pectoris: Secondary | ICD-10-CM | POA: Diagnosis not present

## 2018-01-08 DIAGNOSIS — I132 Hypertensive heart and chronic kidney disease with heart failure and with stage 5 chronic kidney disease, or end stage renal disease: Secondary | ICD-10-CM | POA: Insufficient documentation

## 2018-01-08 DIAGNOSIS — I420 Dilated cardiomyopathy: Secondary | ICD-10-CM | POA: Diagnosis not present

## 2018-01-08 DIAGNOSIS — Z7982 Long term (current) use of aspirin: Secondary | ICD-10-CM | POA: Diagnosis not present

## 2018-01-08 DIAGNOSIS — N1832 Chronic kidney disease, stage 3b: Secondary | ICD-10-CM | POA: Diagnosis present

## 2018-01-08 DIAGNOSIS — N183 Chronic kidney disease, stage 3 (moderate): Secondary | ICD-10-CM

## 2018-01-08 DIAGNOSIS — I5030 Unspecified diastolic (congestive) heart failure: Secondary | ICD-10-CM | POA: Diagnosis not present

## 2018-01-08 DIAGNOSIS — Z882 Allergy status to sulfonamides status: Secondary | ICD-10-CM | POA: Insufficient documentation

## 2018-01-08 DIAGNOSIS — Z87891 Personal history of nicotine dependence: Secondary | ICD-10-CM | POA: Insufficient documentation

## 2018-01-08 DIAGNOSIS — I10 Essential (primary) hypertension: Secondary | ICD-10-CM | POA: Diagnosis present

## 2018-01-08 DIAGNOSIS — R9430 Abnormal result of cardiovascular function study, unspecified: Secondary | ICD-10-CM | POA: Diagnosis present

## 2018-01-08 DIAGNOSIS — R072 Precordial pain: Secondary | ICD-10-CM | POA: Diagnosis not present

## 2018-01-08 DIAGNOSIS — I249 Acute ischemic heart disease, unspecified: Secondary | ICD-10-CM | POA: Diagnosis present

## 2018-01-08 HISTORY — PX: LEFT HEART CATH AND CORONARY ANGIOGRAPHY: CATH118249

## 2018-01-08 LAB — BASIC METABOLIC PANEL
ANION GAP: 8 (ref 5–15)
BUN: 39 mg/dL — AB (ref 6–20)
CALCIUM: 8.6 mg/dL — AB (ref 8.9–10.3)
CO2: 25 mmol/L (ref 22–32)
Chloride: 106 mmol/L (ref 101–111)
Creatinine, Ser: 4.68 mg/dL — ABNORMAL HIGH (ref 0.61–1.24)
GFR calc Af Amer: 14 mL/min — ABNORMAL LOW (ref >60)
GFR calc non Af Amer: 12 mL/min — ABNORMAL LOW (ref >60)
GLUCOSE: 112 mg/dL — AB (ref 65–99)
Potassium: 3.6 mmol/L (ref 3.5–5.1)
Sodium: 139 mmol/L (ref 135–145)

## 2018-01-08 LAB — CBC
HEMATOCRIT: 37 % — AB (ref 39.0–52.0)
Hemoglobin: 12.1 g/dL — ABNORMAL LOW (ref 13.0–17.0)
MCH: 30.9 pg (ref 26.0–34.0)
MCHC: 32.7 g/dL (ref 30.0–36.0)
MCV: 94.6 fL (ref 78.0–100.0)
Platelets: 165 10*3/uL (ref 150–400)
RBC: 3.91 MIL/uL — ABNORMAL LOW (ref 4.22–5.81)
RDW: 14.6 % (ref 11.5–15.5)
WBC: 5.8 10*3/uL (ref 4.0–10.5)

## 2018-01-08 SURGERY — LEFT HEART CATH AND CORONARY ANGIOGRAPHY
Anesthesia: LOCAL

## 2018-01-08 MED ORDER — SODIUM CHLORIDE 0.9 % IV SOLN: 250.0000 mL | INTRAVENOUS | Status: DC | PRN

## 2018-01-08 MED ORDER — HEPARIN (PORCINE) IN NACL 2-0.9 UNITS/ML
INTRAMUSCULAR | Status: AC | PRN
  Administered 2018-01-08: 1000 mL

## 2018-01-08 MED ORDER — MIDAZOLAM HCL 2 MG/2ML IJ SOLN
INTRAMUSCULAR | Status: AC
  Filled 2018-01-08: qty 2

## 2018-01-08 MED ORDER — LABETALOL HCL 5 MG/ML IV SOLN
INTRAVENOUS | Status: AC
  Filled 2018-01-08: qty 4

## 2018-01-08 MED ORDER — CARVEDILOL 12.5 MG PO TABS: 12.5000 mg | ORAL_TABLET | Freq: Two times a day (BID) | ORAL | Status: DC

## 2018-01-08 MED ORDER — TRAZODONE HCL 100 MG PO TABS
100.0000 mg | ORAL_TABLET | Freq: Every day | ORAL | Status: DC
  Administered 2018-01-08: 100 mg via ORAL
  Filled 2018-01-08: qty 1

## 2018-01-08 MED ORDER — ROSUVASTATIN CALCIUM 10 MG PO TABS
10.0000 mg | ORAL_TABLET | Freq: Every day | ORAL | Status: DC
  Administered 2018-01-08 – 2018-01-09 (×2): 10 mg via ORAL
  Filled 2018-01-08 (×2): qty 1

## 2018-01-08 MED ORDER — CLONIDINE HCL 0.1 MG PO TABS
0.1000 mg | ORAL_TABLET | Freq: Every day | ORAL | Status: DC
  Administered 2018-01-08 – 2018-01-09 (×2): 0.1 mg via ORAL
  Filled 2018-01-08 (×3): qty 1

## 2018-01-08 MED ORDER — ACETAMINOPHEN 325 MG PO TABS: 650.0000 mg | ORAL_TABLET | ORAL | Status: DC | PRN

## 2018-01-08 MED ORDER — FENTANYL CITRATE (PF) 100 MCG/2ML IJ SOLN
INTRAMUSCULAR | Status: AC
  Filled 2018-01-08: qty 2

## 2018-01-08 MED ORDER — VITAMIN D 1000 UNITS PO TABS
5000.0000 [IU] | ORAL_TABLET | Freq: Every day | ORAL | Status: DC
  Administered 2018-01-08 – 2018-01-09 (×2): 5000 [IU] via ORAL
  Filled 2018-01-08 (×4): qty 5

## 2018-01-08 MED ORDER — FLUTICASONE PROPIONATE 50 MCG/ACT NA SUSP
1.0000 | Freq: Every day | NASAL | Status: DC | PRN
  Administered 2018-01-09: 1 via NASAL
  Filled 2018-01-08 (×2): qty 16

## 2018-01-08 MED ORDER — ONDANSETRON HCL 4 MG/2ML IJ SOLN: 4.0000 mg | Freq: Four times a day (QID) | INTRAMUSCULAR | Status: DC | PRN

## 2018-01-08 MED ORDER — SODIUM CHLORIDE 0.9% FLUSH: 3.0000 mL | Freq: Two times a day (BID) | INTRAVENOUS | Status: DC

## 2018-01-08 MED ORDER — LIDOCAINE HCL (PF) 1 % IJ SOLN
INTRAMUSCULAR | Status: DC | PRN
  Administered 2018-01-08: 15 mL via SUBCUTANEOUS

## 2018-01-08 MED ORDER — SODIUM CHLORIDE 0.9% FLUSH
3.0000 mL | Freq: Two times a day (BID) | INTRAVENOUS | Status: DC
  Administered 2018-01-08: 3 mL via INTRAVENOUS

## 2018-01-08 MED ORDER — SODIUM CHLORIDE 0.9 % WEIGHT BASED INFUSION
1.0000 mL/kg/h | INTRAVENOUS | Status: DC
  Administered 2018-01-08: 1 mL/kg/h via INTRAVENOUS

## 2018-01-08 MED ORDER — HYDRALAZINE HCL 20 MG/ML IJ SOLN
INTRAMUSCULAR | Status: DC | PRN
  Administered 2018-01-08 (×2): 10 mg via INTRAVENOUS

## 2018-01-08 MED ORDER — SODIUM CHLORIDE 0.9 % IV SOLN: INTRAVENOUS | Status: AC

## 2018-01-08 MED ORDER — ASPIRIN 81 MG PO CHEW
81.0000 mg | CHEWABLE_TABLET | ORAL | Status: AC
  Administered 2018-01-08: 81 mg via ORAL

## 2018-01-08 MED ORDER — IOHEXOL 350 MG/ML SOLN
INTRAVENOUS | Status: DC | PRN
  Administered 2018-01-08: 45 mL via INTRA_ARTERIAL

## 2018-01-08 MED ORDER — DIGOXIN 0.0625 MG HALF TABLET
0.0625 mg | ORAL_TABLET | Freq: Every day | ORAL | Status: DC
Start: 2018-01-08 — End: 2018-01-09
  Filled 2018-01-08 (×3): qty 1

## 2018-01-08 MED ORDER — MIDAZOLAM HCL 2 MG/2ML IJ SOLN
INTRAMUSCULAR | Status: DC | PRN
  Administered 2018-01-08: 1 mg via INTRAVENOUS

## 2018-01-08 MED ORDER — SODIUM CHLORIDE 0.9% FLUSH: 3.0000 mL | INTRAVENOUS | Status: DC | PRN

## 2018-01-08 MED ORDER — DOCUSATE SODIUM 100 MG PO CAPS
200.0000 mg | ORAL_CAPSULE | Freq: Every day | ORAL | Status: DC
  Administered 2018-01-08 – 2018-01-09 (×2): 200 mg via ORAL
  Filled 2018-01-08 (×2): qty 2

## 2018-01-08 MED ORDER — LABETALOL HCL 5 MG/ML IV SOLN
10.0000 mg | Freq: Once | INTRAVENOUS | Status: AC
  Administered 2018-01-08: 10 mg via INTRAVENOUS

## 2018-01-08 MED ORDER — SODIUM BICARBONATE 650 MG PO TABS
1000.0000 mg | ORAL_TABLET | Freq: Three times a day (TID) | ORAL | Status: DC
  Administered 2018-01-08 – 2018-01-09 (×3): 975 mg via ORAL
  Filled 2018-01-08 (×3): qty 2

## 2018-01-08 MED ORDER — ALLOPURINOL 300 MG PO TABS
150.0000 mg | ORAL_TABLET | Freq: Every day | ORAL | Status: DC
  Administered 2018-01-08 – 2018-01-09 (×2): 150 mg via ORAL
  Filled 2018-01-08 (×2): qty 1

## 2018-01-08 MED ORDER — CO-ENZYME Q-10 30 MG PO CAPS: 30.0000 mg | ORAL_CAPSULE | Freq: Every day | ORAL | Status: DC

## 2018-01-08 MED ORDER — ALPRAZOLAM 0.25 MG PO TABS
0.2500 mg | ORAL_TABLET | Freq: Two times a day (BID) | ORAL | Status: DC | PRN
  Administered 2018-01-08: 0.25 mg via ORAL
  Filled 2018-01-08: qty 1

## 2018-01-08 MED ORDER — SODIUM CHLORIDE 0.9 % WEIGHT BASED INFUSION
3.0000 mL/kg/h | INTRAVENOUS | Status: AC
  Administered 2018-01-08: 3 mL/kg/h via INTRAVENOUS

## 2018-01-08 MED ORDER — ISOSORB DINITRATE-HYDRALAZINE 20-37.5 MG PO TABS
1.0000 | ORAL_TABLET | Freq: Three times a day (TID) | ORAL | Status: DC
  Administered 2018-01-08 – 2018-01-09 (×3): 1 via ORAL
  Filled 2018-01-08 (×3): qty 1

## 2018-01-08 MED ORDER — LIDOCAINE HCL (PF) 1 % IJ SOLN
INTRAMUSCULAR | Status: AC
  Filled 2018-01-08: qty 30

## 2018-01-08 MED ORDER — ASPIRIN EC 81 MG PO TBEC
81.0000 mg | DELAYED_RELEASE_TABLET | Freq: Every day | ORAL | Status: DC
  Administered 2018-01-09: 81 mg via ORAL
  Filled 2018-01-08: qty 1

## 2018-01-08 MED ORDER — HYDRALAZINE HCL 20 MG/ML IJ SOLN
INTRAMUSCULAR | Status: AC
  Filled 2018-01-08: qty 1

## 2018-01-08 MED ORDER — HEPARIN (PORCINE) IN NACL 1000-0.9 UT/500ML-% IV SOLN
INTRAVENOUS | Status: AC
  Filled 2018-01-08: qty 1000

## 2018-01-08 MED ORDER — FENTANYL CITRATE (PF) 100 MCG/2ML IJ SOLN
INTRAMUSCULAR | Status: DC | PRN
  Administered 2018-01-08: 25 ug via INTRAVENOUS

## 2018-01-08 MED ORDER — LABETALOL HCL 5 MG/ML IV SOLN
INTRAVENOUS | Status: DC | PRN
  Administered 2018-01-08: 10 mg via INTRAVENOUS

## 2018-01-08 MED ORDER — ADULT MULTIVITAMIN W/MINERALS CH
1.0000 | ORAL_TABLET | Freq: Every day | ORAL | Status: DC
  Administered 2018-01-09: 1 via ORAL
  Filled 2018-01-08 (×2): qty 1

## 2018-01-08 MED ORDER — HYDRALAZINE HCL 20 MG/ML IJ SOLN
10.0000 mg | Freq: Four times a day (QID) | INTRAMUSCULAR | Status: DC | PRN
  Administered 2018-01-09: 10 mg via INTRAVENOUS
  Filled 2018-01-08: qty 1

## 2018-01-08 MED ORDER — ASPIRIN 81 MG PO CHEW
CHEWABLE_TABLET | ORAL | Status: AC
  Filled 2018-01-08: qty 1

## 2018-01-08 SURGICAL SUPPLY — 6 items
CATH INFINITI 5FR MULTPACK ANG (CATHETERS) ×1 IMPLANT
KIT HEART LEFT (KITS) ×2 IMPLANT
PACK CARDIAC CATHETERIZATION (CUSTOM PROCEDURE TRAY) ×2 IMPLANT
SHEATH PINNACLE 5F 10CM (SHEATH) ×1 IMPLANT
TRANSDUCER W/STOPCOCK (MISCELLANEOUS) ×2 IMPLANT
WIRE EMERALD 3MM-J .035X150CM (WIRE) ×1 IMPLANT

## 2018-01-08 NOTE — H&P (Signed)
Referring Physician:  WESTIN KNOTTS is an 67 y.o. male.                       Chief Complaint: Chest pain  HPI: 67 year old male has exertional shortness of breath and chest pain. He had abnormal stress-echocardiogram as pre op evaluation for renal transplantation. He is here for coronary angiography. He is hydrated well to minimize renal injury.  Past Medical History:  Diagnosis Date  . CHF (congestive heart failure)   . CHF (congestive heart failure)   . 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 Essential hypertension   . Hypertension       No past surgical history on file.  No family history on file. Social History:  reports that he has quit smoking. He does not have any smokeless tobacco history on file. He reports that he does not drink alcohol. His drug history is not on file.  Allergies:  Allergies  Allergen Reactions  . Sulfamethoxazole Other (See Comments)    Delusions    Medications Prior to Admission  Medication Sig Dispense Refill  . allopurinol (ZYLOPRIM) 300 MG tablet Take 300 mg by mouth daily.    Marland Kitchen ALPRAZolam (XANAX) 0.25 MG tablet Take 0.25 mg by mouth 2 (two) times daily as needed for anxiety.     Marland Kitchen aspirin EC 81 MG tablet Take 81 mg by mouth daily.    Marland Kitchen b complex vitamins tablet Take 1 tablet by mouth daily.    Marland Kitchen BIDIL 20-37.5 MG tablet Take 1 tablet by mouth 3 (three) times daily.  4  . carvedilol (COREG) 12.5 MG tablet Take 12.5 mg by mouth 2 (two) times daily.    . Cholecalciferol (VITAMIN D3) 5000 units TABS Take 5,000 Units by mouth daily.    . Choline Fenofibrate (FENOFIBRIC ACID) 135 MG CPDR Take 135 mg by mouth 3 (three) times a week. Monday, Wednesday and Friday    . cloNIDine (CATAPRES) 0.2 MG tablet Take 0.2 mg by mouth 2 (two) times daily.    Marland Kitchen Co-Enzyme Q-10 30 MG CAPS Take 30 mg by mouth daily.    . digoxin (LANOXIN) 0.25 MG tablet Take 250 mcg by mouth daily.     Marland Kitchen docusate sodium (COLACE) 100  MG capsule Take 200 mg by mouth daily.     . fluticasone (FLONASE) 50 MCG/ACT nasal spray Place 1 spray into both nostrils daily as needed for allergies.     . furosemide (LASIX) 40 MG tablet Take 40 mg by mouth 2 (two) times daily.    . irbesartan (AVAPRO) 150 MG tablet Take 300 mg by mouth daily.     Marland Kitchen KLOR-CON M20 20 MEQ tablet Take 20 mEq by mouth daily.    . Multiple Vitamins-Minerals (CENTRUM SILVER ADULT 50+) TABS Take 1 tablet by mouth daily.    . rosuvastatin (CRESTOR) 10 MG tablet Take 10 mg by mouth daily.  4  . Saw Palmetto, Serenoa repens, (SAW PALMETTO EXTRACT PO) Take 160 mg by mouth daily.    . SODIUM BICARBONATE PO Take 1,000 mg by mouth 3 (three) times daily.    . traZODone (DESYREL) 100 MG tablet Take 100 mg by mouth at bedtime.    . chlorhexidine (PERIDEX) 0.12 % solution 15 mLs by Mouth Rinse route once a week.  1    Results for orders placed or performed during the hospital encounter of 01/08/18 (  from the past 48 hour(s))  Basic metabolic panel     Status: Abnormal   Collection Time: 01/08/18  7:06 AM  Result Value Ref Range   Sodium 139 135 - 145 mmol/L   Potassium 3.6 3.5 - 5.1 mmol/L   Chloride 106 101 - 111 mmol/L   CO2 25 22 - 32 mmol/L   Glucose, Bld 112 (H) 65 - 99 mg/dL   BUN 39 (H) 6 - 20 mg/dL   Creatinine, Ser 4.68 (H) 0.61 - 1.24 mg/dL   Calcium 8.6 (L) 8.9 - 10.3 mg/dL   GFR calc non Af Amer 12 (L) >60 mL/min   GFR calc Af Amer 14 (L) >60 mL/min    Comment: (NOTE) The eGFR has been calculated using the CKD EPI equation. This calculation has not been validated in all clinical situations. eGFR's persistently <60 mL/min signify possible Chronic Kidney Disease.    Anion gap 8 5 - 15    Comment: Performed at [facility] [address], [place], [place] [address]  CBC     Status: Abnormal   Collection Time: 01/08/18  7:06 AM  Result Value Ref Range   WBC 5.8 4.0 - 10.5 K/uL   RBC 3.91 (L) 4.22 - 5.81 MIL/uL   Hemoglobin 12.1 (L) 13.0 - 17.0  g/dL   HCT 37.0 (L) 39.0 - 52.0 %   MCV 94.6 78.0 - 100.0 fL   MCH 30.9 26.0 - 34.0 pg   MCHC 32.7 30.0 - 36.0 g/dL   RDW 14.6 11.5 - 15.5 %   Platelets 165 150 - 400 K/uL    Comment: Performed at [facility], [place] [address] [place], [place] [address]   No results found.  Review Of Systems Constitutional: No fever, chills, weight loss or gain. Eyes: No vision change, wears glasses. No discharge or pain. Ears: No hearing loss, No tinnitus. Respiratory: No asthma, COPD, pneumonias. No shortness of breath. No hemoptysis. Cardiovascular: No chest pain, palpitation, leg edema. Gastrointestinal: No nausea, vomiting, diarrhea, constipation. No GI bleed. No hepatitis. Genitourinary: No dysuria, hematuria, kidney stone. No incontinance. Neurological: No headache, stroke, seizures.  Psychiatry: No psych facility admission for anxiety, depression, suicide. No detox. Skin: No rash. Musculoskeletal: No joint pain, fibromyalgia. No neck pain, back pain. Lymphadenopathy: No lymphadenopathy. Hematology: No anemia or easy bruising.   Blood pressure 130/85, pulse 62, temperature 97.6 F (36.4 C), temperature source Oral, height '5\' 11"'$  (1.803 m), weight 83 kg (183 lb), SpO2 99 %. Body mass index is 25.52 kg/m. General appearance: alert, cooperative, appears stated age and no distress Head: Normocephalic, atraumatic. Eyes: Brown eyes, pink conjunctiva, corneas clear. PERRL, EOM's intact. Neck: No adenopathy, no carotid bruit, no JVD, supple, symmetrical, trachea midline and thyroid not enlarged. Resp: Clear to auscultation bilaterally. Cardio: Regular rate and rhythm, S1, S2 normal, II/VI systolic murmur, no click, rub or gallop GI: Soft, non-tender; bowel sounds normal; no organomegaly. Extremities: No edema, cyanosis or clubbing. 2 + peripheral pulses. Skin: Warm and dry.  Neurologic: Alert and oriented X 3, normal strength. Normal coordination and gait.  Assessment/Plan Exertional  dyspnea r/o CAD Abnormal stress echocardiogram Hypertension Chronic kidney disease, IV  Coronary angiography. Patient understood procedure, risks and alternatives.  Birdie Riddle, MD  01/08/2018, 8:12 AM

## 2018-01-08 NOTE — Progress Notes (Signed)
  Echocardiogram 2D Echocardiogram has been performed.  Jannett Celestine 01/08/2018, 9:13 AM

## 2018-01-09 ENCOUNTER — Observation Stay (HOSPITAL_COMMUNITY): Payer: Medicare Other

## 2018-01-09 ENCOUNTER — Encounter (HOSPITAL_COMMUNITY): Payer: Self-pay | Admitting: Cardiovascular Disease

## 2018-01-09 DIAGNOSIS — R0602 Shortness of breath: Secondary | ICD-10-CM | POA: Diagnosis not present

## 2018-01-09 DIAGNOSIS — I2510 Atherosclerotic heart disease of native coronary artery without angina pectoris: Secondary | ICD-10-CM | POA: Diagnosis not present

## 2018-01-09 DIAGNOSIS — I5030 Unspecified diastolic (congestive) heart failure: Secondary | ICD-10-CM | POA: Diagnosis not present

## 2018-01-09 DIAGNOSIS — I132 Hypertensive heart and chronic kidney disease with heart failure and with stage 5 chronic kidney disease, or end stage renal disease: Secondary | ICD-10-CM | POA: Diagnosis not present

## 2018-01-09 LAB — BASIC METABOLIC PANEL
ANION GAP: 8 (ref 5–15)
BUN: 39 mg/dL — ABNORMAL HIGH (ref 6–20)
CHLORIDE: 108 mmol/L (ref 101–111)
CO2: 25 mmol/L (ref 22–32)
Calcium: 8.5 mg/dL — ABNORMAL LOW (ref 8.9–10.3)
Creatinine, Ser: 4.47 mg/dL — ABNORMAL HIGH (ref 0.61–1.24)
GFR calc non Af Amer: 12 mL/min — ABNORMAL LOW (ref >60)
GFR, EST AFRICAN AMERICAN: 14 mL/min — AB (ref >60)
Glucose, Bld: 105 mg/dL — ABNORMAL HIGH (ref 65–99)
Potassium: 3.3 mmol/L — ABNORMAL LOW (ref 3.5–5.1)
Sodium: 141 mmol/L (ref 135–145)

## 2018-01-09 LAB — CBC
HEMATOCRIT: 36.3 % — AB (ref 39.0–52.0)
HEMOGLOBIN: 11.8 g/dL — AB (ref 13.0–17.0)
MCH: 30.6 pg (ref 26.0–34.0)
MCHC: 32.5 g/dL (ref 30.0–36.0)
MCV: 94 fL (ref 78.0–100.0)
Platelets: 151 10*3/uL (ref 150–400)
RBC: 3.86 MIL/uL — AB (ref 4.22–5.81)
RDW: 14.6 % (ref 11.5–15.5)
WBC: 6 10*3/uL (ref 4.0–10.5)

## 2018-01-09 LAB — ECHOCARDIOGRAM COMPLETE
HEIGHTINCHES: 71 in
WEIGHTICAEL: 2928 [oz_av]

## 2018-01-09 MED ORDER — DIGOXIN 0.05 MG/ML PO SOLN
0.0625 mg | Freq: Every day | ORAL | Status: DC
  Filled 2018-01-09: qty 2.5

## 2018-01-09 MED ORDER — REGADENOSON 0.4 MG/5ML IV SOLN
INTRAVENOUS | Status: AC
  Filled 2018-01-09: qty 5

## 2018-01-09 MED ORDER — POTASSIUM CHLORIDE CRYS ER 10 MEQ PO TBCR
20.0000 meq | EXTENDED_RELEASE_TABLET | Freq: Three times a day (TID) | ORAL | Status: DC
  Administered 2018-01-09: 20 meq via ORAL
  Filled 2018-01-09 (×2): qty 2

## 2018-01-09 MED ORDER — ALLOPURINOL 100 MG PO TABS: 100.0000 mg | ORAL_TABLET | Freq: Every day | ORAL | Status: DC

## 2018-01-09 MED ORDER — REGADENOSON 0.4 MG/5ML IV SOLN
0.4000 mg | Freq: Once | INTRAVENOUS | Status: AC
  Administered 2018-01-09: 0.4 mg via INTRAVENOUS
  Filled 2018-01-09: qty 5

## 2018-01-09 MED ORDER — ALLOPURINOL 100 MG PO TABS: 100.0000 mg | ORAL_TABLET | Freq: Every day | ORAL | 3 refills | Status: AC

## 2018-01-09 MED ORDER — TECHNETIUM TC 99M TETROFOSMIN IV KIT
30.0000 | PACK | Freq: Once | INTRAVENOUS | Status: AC | PRN
  Administered 2018-01-09: 30 via INTRAVENOUS

## 2018-01-09 MED ORDER — AMLODIPINE BESYLATE 5 MG PO TABS
5.0000 mg | ORAL_TABLET | Freq: Every day | ORAL | Status: DC
  Administered 2018-01-09: 5 mg via ORAL
  Filled 2018-01-09: qty 1

## 2018-01-09 MED ORDER — CLONIDINE HCL 0.2 MG PO TABS: 0.2000 mg | ORAL_TABLET | Freq: Every day | ORAL | Status: AC

## 2018-01-09 MED ORDER — TECHNETIUM TC 99M TETROFOSMIN IV KIT
10.0000 | PACK | Freq: Once | INTRAVENOUS | Status: AC | PRN
  Administered 2018-01-09: 10 via INTRAVENOUS

## 2018-01-09 MED ORDER — DIGOXIN 250 MCG PO TABS: 125.0000 ug | ORAL_TABLET | ORAL | Status: DC

## 2018-01-09 MED ORDER — KLOR-CON M20 20 MEQ PO TBCR: 20.0000 meq | EXTENDED_RELEASE_TABLET | Freq: Two times a day (BID) | ORAL | 3 refills | Status: DC

## 2018-01-09 MED ORDER — AMLODIPINE BESYLATE 5 MG PO TABS: 5.0000 mg | ORAL_TABLET | Freq: Every day | ORAL | 3 refills | Status: AC

## 2018-01-09 MED FILL — Heparin Sod (Porcine)-NaCl IV Soln 1000 Unit/500ML-0.9%: INTRAVENOUS | Qty: 1000 | Status: AC

## 2018-01-09 NOTE — Discharge Summary (Signed)
Physician Discharge Summary  Patient ID: Logan Thomas MRN: [id number] DOB/AGE: [birth year] 67 y.o.  Admit date: 01/08/2018 Discharge date: 01/09/2018  Admission Diagnoses: Exertional dyspnea R/O CAD Abnormal stress echocardiogram Hypertension Chronic kidney disease, IV  Discharge Diagnoses:  Principal Problem:   Unstable angina Active Problems:   Chronic kidney disease (CKD) stage V from uncontrolled hypertension   Essential hypertension   Dilated cardiomyopathy  Discharged Condition: fair  Hospital Course: 67 year old male had abnornmal stress echocardiogram as pre-op evaluation for renal transplantation. He has long standing history of hypertension and CKD stage IV to V. He underwent cardiac catheterization that showed 90 % stenosis of distal LAD near apex of the heart with very small segment of artery left post stenosis. He had non-critical stenosis in diagonal 1 vessel of LAD and inferior branch of posterolateral artery of RCA. His nuclear stress test did not show reversible ischemia. Echocardiogram showed moderate LVH and mild systolic dysfunction. EF was 40-45 %.  His medications were adjusted to improve blood pressure control. Post cardiac cath care was discussed.  He was discharged home in stable condition with follow up by me in 1 week and by renal transplant team at [facility] in [place] as arranged.   Consults: cardiology  Significant Diagnostic Studies: labs: Normal electrolytes, BUN 39 and Creatinine 4.68 mg. CBC was near normal except early anemia of chronic disease.   EKG: Sinus rhythm, LVH and non-specific ST changes.  Echocardiogram: Moderate LVH and mild LV systolic dysfunction with septal hypokinesia. EF 40-45 %.  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  NM myocardial perfusion test was negative for reversible ischemia. EF 37 %.   Treatments: cardiac meds: Bidil, carvedilol, amlodipine,  digoxin, rosuvastatin, furosemide and aspirin.  Discharge Exam: Blood pressure (!) 180/103, pulse 67, temperature 97.9 F (36.6 C), temperature source Oral, resp. rate 20, height 5\' 11"  (1.803 m), weight 80.4 kg (177 lb 4.8 oz), SpO2 97 %. General appearance: alert, cooperative and appears stated age. Head: Normocephalic, atraumatic. Eyes: Brown eyes, pink conjunctiva, corneas clear. PERRL, EOM's intact.  Neck: No adenopathy, no carotid bruit, no JVD, supple, symmetrical, trachea midline and thyroid not enlarged. Resp: Clear to auscultation bilaterally. Cardio: Regular rate and rhythm, S1, S2 normal, II/VI systolic murmur, no click, rub or gallop. GI: Soft, non-tender; bowel sounds normal; no organomegaly. Extremities: No edema, cyanosis or clubbing. No right groin hematoma. Skin: Warm and dry.  Neurologic: Alert and oriented X 3, normal strength and tone. Normal coordination and gait.  Disposition: Discharge disposition: 01-Home or Self Care        Allergies as of 01/09/2018      Reactions   Sulfamethoxazole Other (See Comments)   Delusions      Medication List    STOP taking these medications   b complex vitamins tablet   irbesartan 150 MG tablet Commonly known as:  AVAPRO     TAKE these medications   allopurinol 100 MG tablet Commonly known as:  ZYLOPRIM Take 1 tablet (100 mg total) by mouth daily. What changed:    medication strength  how much to take   ALPRAZolam 0.25 MG tablet Commonly known as:  XANAX Take 0.25 mg by mouth 2 (two) times daily as needed for anxiety.   amLODipine 5 MG tablet Commonly known as:  NORVASC Take 1 tablet (5 mg total) by mouth daily.   aspirin EC 81 MG tablet Take 81 mg by mouth daily.   BIDIL 20-37.5 MG tablet Generic  drug:  isosorbide-hydrALAZINE Take 1 tablet by mouth 3 (three) times daily.   carvedilol 12.5 MG tablet Commonly known as:  COREG Take 12.5 mg by mouth 2 (two) times daily.   CENTRUM SILVER ADULT 50+  Tabs Take 1 tablet by mouth daily.   chlorhexidine 0.12 % solution Commonly known as:  PERIDEX 15 mLs by Mouth Rinse route once a week.   cloNIDine 0.2 MG tablet Commonly known as:  CATAPRES Take 1 tablet (0.2 mg total) by mouth at bedtime. What changed:  when to take this   Co-Enzyme Q-10 30 MG Caps Take 30 mg by mouth daily.   digoxin 0.25 MG tablet Commonly known as:  LANOXIN Take 0.5 tablets (125 mcg total) by mouth every Monday, Wednesday, and Friday. Start taking on:  01/10/2018 What changed:    how much to take  when to take this   docusate sodium 100 MG capsule Commonly known as:  COLACE Take 200 mg by mouth daily.   Fenofibric Acid 135 MG Cpdr Take 135 mg by mouth 3 (three) times a week. Monday, Wednesday and Friday   fluticasone 50 MCG/ACT nasal spray Commonly known as:  FLONASE Place 1 spray into both nostrils daily as needed for allergies.   furosemide 40 MG tablet Commonly known as:  LASIX Take 40 mg by mouth 2 (two) times daily.   KLOR-CON M20 20 MEQ tablet Generic drug:  potassium chloride SA Take 1 tablet (20 mEq total) by mouth 2 (two) times daily. What changed:  when to take this   rosuvastatin 10 MG tablet Commonly known as:  CRESTOR Take 10 mg by mouth daily.   SAW PALMETTO EXTRACT PO Take 160 mg by mouth daily.   SODIUM BICARBONATE PO Take 1,000 mg by mouth 3 (three) times daily.   traZODone 100 MG tablet Commonly known as:  DESYREL Take 100 mg by mouth at bedtime.   Vitamin D3 5000 units Tabs Take 5,000 Units by mouth daily.      Follow-up Information    Vincente Liberty, MD. Schedule an appointment as soon as possible for a visit in 1 month(s).   Specialty:  Pulmonary Disease Contact information: [place] [address] [phone]        Dixie Dials, MD. Schedule an appointment as soon as possible for a visit in 1 week(s).   Specialty:  Cardiology Contact information: [place] [address] [phone]           Signed: Birdie Riddle 01/09/2018, 1:08 PM

## 2018-01-09 NOTE — Progress Notes (Signed)
Reviewed discharge AVS with patient and family member. Answered their questions. Pt is stable and ready for discharge.

## 2019-04-21 ENCOUNTER — Other Ambulatory Visit (HOSPITAL_COMMUNITY): Payer: Medicare Other

## 2019-04-21 ENCOUNTER — Encounter (HOSPITAL_COMMUNITY): Payer: Medicare Other

## 2019-04-21 ENCOUNTER — Encounter: Payer: Medicare Other | Admitting: Vascular Surgery

## 2019-05-08 ENCOUNTER — Other Ambulatory Visit: Payer: Self-pay

## 2019-05-08 DIAGNOSIS — N1832 Chronic kidney disease, stage 3b: Secondary | ICD-10-CM

## 2019-05-08 DIAGNOSIS — N185 Chronic kidney disease, stage 5: Secondary | ICD-10-CM

## 2019-05-11 ENCOUNTER — Telehealth (HOSPITAL_COMMUNITY): Payer: Self-pay | Admitting: *Deleted

## 2019-05-11 NOTE — Telephone Encounter (Signed)

## 2019-05-12 ENCOUNTER — Encounter: Payer: Self-pay | Admitting: Vascular Surgery

## 2019-05-12 ENCOUNTER — Ambulatory Visit (INDEPENDENT_AMBULATORY_CARE_PROVIDER_SITE_OTHER)
Admission: RE | Admit: 2019-05-12 | Discharge: 2019-05-12 | Disposition: A | Discharge status: Home / Self Care | Payer: Medicare Other | Source: Ambulatory Visit | Attending: Vascular Surgery | Admitting: Vascular Surgery

## 2019-05-12 ENCOUNTER — Ambulatory Visit (INDEPENDENT_AMBULATORY_CARE_PROVIDER_SITE_OTHER): Payer: Medicare Other | Admitting: Vascular Surgery

## 2019-05-12 ENCOUNTER — Ambulatory Visit (HOSPITAL_COMMUNITY)
Admission: RE | Admit: 2019-05-12 | Discharge: 2019-05-12 | Disposition: A | Discharge status: Home / Self Care | Payer: Medicare Other | Source: Ambulatory Visit | Attending: Vascular Surgery | Admitting: Vascular Surgery

## 2019-05-12 ENCOUNTER — Other Ambulatory Visit: Payer: Self-pay

## 2019-05-12 DIAGNOSIS — I120 Hypertensive chronic kidney disease with stage 5 chronic kidney disease or end stage renal disease: Secondary | ICD-10-CM | POA: Insufficient documentation

## 2019-05-12 DIAGNOSIS — N185 Chronic kidney disease, stage 5: Secondary | ICD-10-CM | POA: Insufficient documentation

## 2019-05-12 NOTE — Progress Notes (Signed)
Patient name: Logan Thomas MRN: [id number] DOB: 24-Dec-1950 Sex: male  REASON FOR CONSULT: New AV fistula access consult  HPI: Logan Thomas is a 68 y.o. male, with history of stage V chronic kidney disease secondary to HTN and congestive heart failure that presents for new AV fistula access evaluation.  Patient states he is right-handed.  He has never had any access in the past.  He is currently retired.  No chest wall implants or ICDs etc.  Would prefer access in his left arm.  Has not started dialysis at this time.  No arm swelling.  No weakness or tingling in upper extremities.  Past Medical History:  Diagnosis Date  . CHF (congestive heart failure) ([place])   . CHF (congestive heart failure) ([place])   . 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 Essential hypertension   . Hypertension     Past Surgical History:  Procedure Laterality Date  . LEFT HEART CATH AND CORONARY ANGIOGRAPHY N/A 01/08/2018   Procedure: LEFT HEART CATH AND CORONARY ANGIOGRAPHY;  Surgeon: Dixie Dials, MD;  Location: [facility];  Service: Cardiovascular;  Laterality: N/A;    Family History  Problem Relation Age of Onset  . Kidney disease Mother   . Diabetes Mother   . Heart attack Father     SOCIAL HISTORY: Social History   Socioeconomic History  . Marital status: Married    Spouse name: Not on file  . Number of children: Not on file  . Years of education: Not on file  . Highest education level: Not on file  Occupational History  . Not on file  Social Needs  . Financial resource strain: Not on file  . Food insecurity    Worry: Not on file    Inability: Not on file  . Transportation needs    Medical: Not on file    Non-medical: Not on file  Tobacco Use  . Smoking status: Former Research scientist (life sciences)  . Smokeless tobacco: Never Used  Substance and Sexual Activity  . Alcohol use: No  . Drug use: Not on file  . Sexual activity: Not on file   Lifestyle  . Physical activity    Days per week: Not on file    Minutes per session: Not on file  . Stress: Not on file  Relationships  . Social Herbalist on phone: Not on file    Gets together: Not on file    Attends religious service: Not on file    Active member of club or organization: Not on file    Attends meetings of clubs or organizations: Not on file    Relationship status: Not on file  . Intimate partner violence    Fear of current or ex partner: Not on file    Emotionally abused: Not on file    Physically abused: Not on file    Forced sexual activity: Not on file  Other Topics Concern  . Not on file  Social History Narrative  . Not on file    Allergies  Allergen Reactions  . Sulfamethoxazole Other (See Comments)    Delusions    Current Outpatient Medications  Medication Sig Dispense Refill  . allopurinol (ZYLOPRIM) 100 MG tablet Take 1 tablet (100 mg total) by mouth daily. 30 tablet 3  . ALPRAZolam (XANAX) 0.25 MG tablet Take 0.25 mg by mouth 2 (two) times daily as needed for anxiety.     Marland Kitchen  amLODipine (NORVASC) 5 MG tablet Take 1 tablet (5 mg total) by mouth daily. 30 tablet 3  . aspirin EC 81 MG tablet Take 81 mg by mouth daily.    Marland Kitchen BIDIL 20-37.5 MG tablet Take 1 tablet by mouth 3 (three) times daily.  4  . carvedilol (COREG) 12.5 MG tablet Take 12.5 mg by mouth 2 (two) times daily.    . Cholecalciferol (VITAMIN D3) 5000 units TABS Take 5,000 Units by mouth daily.    . Choline Fenofibrate (FENOFIBRIC ACID) 135 MG CPDR Take 135 mg by mouth 3 (three) times a week. Monday, Wednesday and Friday    . cloNIDine (CATAPRES) 0.2 MG tablet Take 1 tablet (0.2 mg total) by mouth at bedtime.    Marland Kitchen Co-Enzyme Q-10 30 MG CAPS Take 30 mg by mouth daily.    . digoxin (LANOXIN) 0.25 MG tablet Take 0.5 tablets (125 mcg total) by mouth every Monday, Wednesday, and Friday.    . docusate sodium (COLACE) 100 MG capsule Take 200 mg by mouth daily.     . fluticasone  (FLONASE) 50 MCG/ACT nasal spray Place 1 spray into both nostrils daily as needed for allergies.     . furosemide (LASIX) 40 MG tablet Take 40 mg by mouth 2 (two) times daily.    Marland Kitchen KLOR-CON M20 20 MEQ tablet Take 1 tablet (20 mEq total) by mouth 2 (two) times daily. 60 tablet 3  . Multiple Vitamins-Minerals (CENTRUM SILVER ADULT 50+) TABS Take 1 tablet by mouth daily.    . rosuvastatin (CRESTOR) 10 MG tablet Take 10 mg by mouth daily.  4  . Saw Palmetto, Serenoa repens, (SAW PALMETTO EXTRACT PO) Take 160 mg by mouth daily.    . SODIUM BICARBONATE PO Take 1,000 mg by mouth 3 (three) times daily.    . traZODone (DESYREL) 100 MG tablet Take 100 mg by mouth at bedtime.    . chlorhexidine (PERIDEX) 0.12 % solution 15 mLs by Mouth Rinse route once a week.  1   No current facility-administered medications for this visit.     REVIEW OF SYSTEMS:  [X]  denotes positive finding, [ ]  denotes negative finding Cardiac  Comments:  Chest pain or chest pressure:    Shortness of breath upon exertion:    Short of breath when lying flat:    Irregular heart rhythm:        Vascular    Pain in calf, thigh, or hip brought on by ambulation:    Pain in feet at night that wakes you up from your sleep:     Blood clot in your veins:    Leg swelling:  x       Pulmonary    Oxygen at home:    Productive cough:     Wheezing:         Neurologic    Sudden weakness in arms or legs:     Sudden numbness in arms or legs:     Sudden onset of difficulty speaking or slurred speech:    Temporary loss of vision in one eye:     Problems with dizziness:         Gastrointestinal    Blood in stool:     Vomited blood:         Genitourinary    Burning when urinating:     Blood in urine:        Psychiatric    Major depression:         Hematologic  Bleeding problems:    Problems with blood clotting too easily:        Skin    Rashes or ulcers:        Constitutional    Fever or chills:      PHYSICAL EXAM:  Vitals:   05/12/19 0925  BP: (!) 189/102  Pulse: (!) 57  Resp: 20  Temp: 97.8 F (36.6 C)  TempSrc: Temporal  SpO2: 100%  Weight: 185 lb (83.9 kg)  Height: 5' 11.5" (1.816 m)    GENERAL: The patient is a well-nourished male, in no acute distress. The vital signs are documented above. CARDIAC: There is a regular rate and rhythm.  VASCULAR:  2+ palpable radial and brachial pulses in bilateral upper extremities No tissue loss upper extremities PULMONARY: There is good air exchange bilaterally without wheezing or rales. ABDOMEN: Soft and non-tender with normal pitched bowel sounds.  MUSCULOSKELETAL: There are no major deformities or cyanosis. NEUROLOGIC: No focal weakness or paresthesias are detected.  DATA:   Upper extremity arterial duplex shows triphasic waveforms in both upper extremities  Upper extremity vein mapping shows nice cephalic veins in both arms with usable vein likely down to the wrist.  Assessment/Plan:  68 year old male with stage V chronic kidney disease secondary to HTN and congestive heart failure that presents for new AV fistula access evaluation.  Discussed that we would plan to proceed with left arm AV fistula placement since he is right-handed.  He has a very nice cephalic vein in the left arm and would certainly evaluate a radiocephalic versus brachiocephalic with SonoSite [place] on the day of surgery.  Risk and benefits were discussed in detail including risk of bleeding, infection, steal, failure to mature.  He asked about peritoneal dialysis and discussed this would be with a general surgeon.  He wants to discuss options again with Dr. Carolin Sicks before scheduling.  He will call our clinic back to schedule in the near future after further discussion with nephrology.   Marty Heck, MD [facility] Office: [phone] Pager: [phone]

## 2019-05-14 ENCOUNTER — Encounter: Payer: Self-pay | Admitting: Vascular Surgery

## 2019-07-01 ENCOUNTER — Encounter (INDEPENDENT_AMBULATORY_CARE_PROVIDER_SITE_OTHER): Payer: Self-pay | Admitting: Otolaryngology

## 2019-07-01 ENCOUNTER — Ambulatory Visit (INDEPENDENT_AMBULATORY_CARE_PROVIDER_SITE_OTHER): Payer: Medicare Other | Admitting: Otolaryngology

## 2019-07-01 ENCOUNTER — Other Ambulatory Visit: Payer: Self-pay

## 2019-07-01 VITALS — Temp 97.5°F

## 2019-07-01 DIAGNOSIS — H903 Sensorineural hearing loss, bilateral: Secondary | ICD-10-CM | POA: Diagnosis not present

## 2019-07-01 NOTE — Progress Notes (Signed)
HPI: Logan Thomas is a 68 y.o. male who presents is referred by Dr. Katherine Roan for evaluation of hearing loss.  He has noticed decline in his hearing over the past 3 to 4 years.  Denies any ear pain.  Presents here to have hearing evaluation..  Past Medical History:  Diagnosis Date  . CHF (congestive heart failure) ([facility])   . CHF (congestive heart failure) ([place])   . 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 Essential hypertension   . Hypertension    Past Surgical History:  Procedure Laterality Date  . LEFT HEART CATH AND CORONARY ANGIOGRAPHY N/A 01/08/2018   Procedure: LEFT HEART CATH AND CORONARY ANGIOGRAPHY;  Surgeon: Dixie Dials, MD;  Location: [facility];  Service: Cardiovascular;  Laterality: N/A;   Social History   Socioeconomic History  . Marital status: Married    Spouse name: Not on file  . Number of children: Not on file  . Years of education: Not on file  . Highest education level: Not on file  Occupational History  . Not on file  Social Needs  . Financial resource strain: Not on file  . Food insecurity    Worry: Not on file    Inability: Not on file  . Transportation needs    Medical: Not on file    Non-medical: Not on file  Tobacco Use  . Smoking status: Former Smoker    Packs/day: 0.75    Years: 14.00    Pack years: 10.50    Start date: 1971    Quit date: 1985    Years since quitting: 35.9  . Smokeless tobacco: Never Used  Substance and Sexual Activity  . Alcohol use: No  . Drug use: Not on file  . Sexual activity: Not on file  Lifestyle  . Physical activity    Days per week: Not on file    Minutes per session: Not on file  . Stress: Not on file  Relationships  . Social Herbalist on phone: Not on file    Gets together: Not on file    Attends religious service: Not on file    Active member of club or organization: Not on file    Attends meetings of clubs or organizations: Not on  file    Relationship status: Not on file  Other Topics Concern  . Not on file  Social History Narrative  . Not on file   Family History  Problem Relation Age of Onset  . Kidney disease Mother   . Diabetes Mother   . Heart attack Father    Allergies  Allergen Reactions  . Sulfamethoxazole Other (See Comments)    Delusions   Prior to Admission medications   Medication Sig Start Date End Date Taking? Authorizing Provider  allopurinol (ZYLOPRIM) 100 MG tablet Take 1 tablet (100 mg total) by mouth daily. 01/09/18  Yes Dixie Dials, MD  ALPRAZolam Duanne Moron) 0.25 MG tablet Take 0.25 mg by mouth 2 (two) times daily as needed for anxiety.  06/01/14  Yes [provider]  amLODipine (NORVASC) 5 MG tablet Take 1 tablet (5 mg total) by mouth daily. 01/09/18  Yes Dixie Dials, MD  aspirin EC 81 MG tablet Take 81 mg by mouth daily.   Yes [provider]  BIDIL 20-37.5 MG tablet Take 1 tablet by mouth 3 (three) times daily. 12/08/17  Yes [provider]  carvedilol (COREG) 12.5  MG tablet Take 12.5 mg by mouth 2 (two) times daily. 06/24/14  Yes [provider]  Cholecalciferol (VITAMIN D3) 5000 units TABS Take 5,000 Units by mouth daily.   Yes [provider]  Choline Fenofibrate (FENOFIBRIC ACID) 135 MG CPDR Take 135 mg by mouth 3 (three) times a week. Monday, Wednesday and Friday   Yes [provider]  cloNIDine (CATAPRES) 0.2 MG tablet Take 1 tablet (0.2 mg total) by mouth at bedtime. 01/09/18  Yes Dixie Dials, MD  Co-Enzyme Q-10 30 MG CAPS Take 30 mg by mouth daily.   Yes [provider]  digoxin (LANOXIN) 0.25 MG tablet Take 0.5 tablets (125 mcg total) by mouth every Monday, Wednesday, and Friday. 01/10/18  Yes Dixie Dials, MD  docusate sodium (COLACE) 100 MG capsule Take 200 mg by mouth daily.    Yes [provider]  fluticasone (FLONASE) 50 MCG/ACT nasal spray Place 1 spray into both nostrils daily as needed for allergies.     Yes [provider]  furosemide (LASIX) 40 MG tablet Take 40 mg by mouth 2 (two) times daily. 05/27/14  Yes [provider]  KLOR-CON M20 20 MEQ tablet Take 1 tablet (20 mEq total) by mouth 2 (two) times daily. 01/09/18  Yes Dixie Dials, MD  Multiple Vitamins-Minerals (CENTRUM SILVER ADULT 50+) TABS Take 1 tablet by mouth daily.   Yes [provider]  rosuvastatin (CRESTOR) 10 MG tablet Take 10 mg by mouth daily. 01/02/18  Yes [provider]  Saw Palmetto, Serenoa repens, (SAW PALMETTO EXTRACT PO) Take 160 mg by mouth daily.   Yes [provider]  SODIUM BICARBONATE PO Take 1,000 mg by mouth 3 (three) times daily.   Yes [provider]  traZODone (DESYREL) 100 MG tablet Take 100 mg by mouth at bedtime. 08/01/14  Yes [provider]  chlorhexidine (PERIDEX) 0.12 % solution 15 mLs by Mouth Rinse route once a week. 11/14/17   [provider]     Positive ROS: Otherwise negative  All other systems have been reviewed and were otherwise negative with the exception of those mentioned in the HPI and as above.  Physical Exam: Constitutional: Alert, well-appearing, no acute distress Ears: External ears without lesions or tenderness. Ear canals are clear bilaterally with intact, clear TMs. Audiogram demonstrated a downsloping moderate SNHL in both ears which was symmetric ranging from 20 DB at 500 frequency down to approximately 50 DB at 4000 frequency.  SRT's were 30 DB bilaterally.  Type a tympanograms bilaterally.  No significant wax buildup. Nasal: External nose without lesions. Septum with mild deformity.. Clear nasal passages Oral: Lips and gums without lesions. Tongue and palate mucosa without lesions. Posterior oropharynx clear. Neck: No palpable adenopathy or masses Respiratory: Breathing comfortably  Skin: No facial/neck lesions or rash noted.  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  Plan: Patient would be a candidate for hearing aids and referred him back to [facility]   Radene Journey, MD   CC:

## 2019-07-23 ENCOUNTER — Encounter (INDEPENDENT_AMBULATORY_CARE_PROVIDER_SITE_OTHER): Payer: Self-pay

## 2019-08-14 HISTORY — PX: NEPHRECTOMY TRANSPLANTED ORGAN: SUR880

## 2019-08-23 ENCOUNTER — Emergency Department (HOSPITAL_COMMUNITY)
Admission: EM | Admit: 2019-08-23 | Discharge: 2019-08-23 | Disposition: A | Discharge status: Home / Self Care | Payer: Medicare PPO | Attending: Emergency Medicine | Admitting: Emergency Medicine

## 2019-08-23 ENCOUNTER — Other Ambulatory Visit: Payer: Self-pay

## 2019-08-23 ENCOUNTER — Encounter (HOSPITAL_COMMUNITY): Payer: Self-pay | Admitting: *Deleted

## 2019-08-23 ENCOUNTER — Emergency Department (HOSPITAL_COMMUNITY): Payer: Medicare PPO

## 2019-08-23 DIAGNOSIS — D649 Anemia, unspecified: Secondary | ICD-10-CM | POA: Diagnosis not present

## 2019-08-23 DIAGNOSIS — I509 Heart failure, unspecified: Secondary | ICD-10-CM | POA: Diagnosis not present

## 2019-08-23 DIAGNOSIS — Z7982 Long term (current) use of aspirin: Secondary | ICD-10-CM | POA: Insufficient documentation

## 2019-08-23 DIAGNOSIS — Z87891 Personal history of nicotine dependence: Secondary | ICD-10-CM | POA: Insufficient documentation

## 2019-08-23 DIAGNOSIS — Z20822 Contact with and (suspected) exposure to covid-19: Secondary | ICD-10-CM | POA: Insufficient documentation

## 2019-08-23 DIAGNOSIS — K922 Gastrointestinal hemorrhage, unspecified: Secondary | ICD-10-CM | POA: Insufficient documentation

## 2019-08-23 DIAGNOSIS — Z940 Kidney transplant status: Secondary | ICD-10-CM | POA: Insufficient documentation

## 2019-08-23 DIAGNOSIS — K5900 Constipation, unspecified: Secondary | ICD-10-CM | POA: Diagnosis present

## 2019-08-23 DIAGNOSIS — I132 Hypertensive heart and chronic kidney disease with heart failure and with stage 5 chronic kidney disease, or end stage renal disease: Secondary | ICD-10-CM | POA: Diagnosis not present

## 2019-08-23 DIAGNOSIS — N185 Chronic kidney disease, stage 5: Secondary | ICD-10-CM | POA: Insufficient documentation

## 2019-08-23 DIAGNOSIS — Z79899 Other long term (current) drug therapy: Secondary | ICD-10-CM | POA: Insufficient documentation

## 2019-08-23 LAB — COMPREHENSIVE METABOLIC PANEL
ALT: 10 U/L (ref 0–44)
AST: 20 U/L (ref 15–41)
Albumin: 1.9 g/dL — ABNORMAL LOW (ref 3.5–5.0)
Alkaline Phosphatase: 23 U/L — ABNORMAL LOW (ref 38–126)
Anion gap: 15 (ref 5–15)
BUN: 106 mg/dL — ABNORMAL HIGH (ref 8–23)
CO2: 19 mmol/L — ABNORMAL LOW (ref 22–32)
Calcium: 7.1 mg/dL — ABNORMAL LOW (ref 8.9–10.3)
Chloride: 101 mmol/L (ref 98–111)
Creatinine, Ser: 7.75 mg/dL — ABNORMAL HIGH (ref 0.61–1.24)
GFR calc Af Amer: 7 mL/min — ABNORMAL LOW (ref >60)
GFR calc non Af Amer: 6 mL/min — ABNORMAL LOW (ref >60)
Glucose, Bld: 228 mg/dL — ABNORMAL HIGH (ref 70–99)
Potassium: 3.2 mmol/L — ABNORMAL LOW (ref 3.5–5.1)
Sodium: 135 mmol/L (ref 135–145)
Total Bilirubin: 0.9 mg/dL (ref 0.3–1.2)
Total Protein: 3.8 g/dL — ABNORMAL LOW (ref 6.5–8.1)

## 2019-08-23 LAB — I-STAT CHEM 8, ED
BUN: 110 mg/dL — ABNORMAL HIGH (ref 8–23)
Calcium, Ion: 0.98 mmol/L — ABNORMAL LOW (ref 1.15–1.40)
Chloride: 97 mmol/L — ABNORMAL LOW (ref 98–111)
Creatinine, Ser: 8.5 mg/dL — ABNORMAL HIGH (ref 0.61–1.24)
Glucose, Bld: 213 mg/dL — ABNORMAL HIGH (ref 70–99)
HCT: 15 % — ABNORMAL LOW (ref 39.0–52.0)
Hemoglobin: 5.1 g/dL — CL (ref 13.0–17.0)
Potassium: 3.4 mmol/L — ABNORMAL LOW (ref 3.5–5.1)
Sodium: 133 mmol/L — ABNORMAL LOW (ref 135–145)
TCO2: 23 mmol/L (ref 22–32)

## 2019-08-23 LAB — CBC
HCT: 16 % — ABNORMAL LOW (ref 39.0–52.0)
Hemoglobin: 5.4 g/dL — CL (ref 13.0–17.0)
MCH: 32.1 pg (ref 26.0–34.0)
MCHC: 33.8 g/dL (ref 30.0–36.0)
MCV: 95.2 fL (ref 80.0–100.0)
Platelets: 196 10*3/uL (ref 150–400)
RBC: 1.68 MIL/uL — ABNORMAL LOW (ref 4.22–5.81)
RDW: 13.4 % (ref 11.5–15.5)
WBC: 14.4 10*3/uL — ABNORMAL HIGH (ref 4.0–10.5)
nRBC: 0 % (ref 0.0–0.2)

## 2019-08-23 LAB — RESPIRATORY PANEL BY RT PCR (FLU A&B, COVID)
Influenza A by PCR: NEGATIVE
Influenza B by PCR: NEGATIVE
SARS Coronavirus 2 by RT PCR: NEGATIVE

## 2019-08-23 LAB — DIGOXIN LEVEL: Digoxin Level: 0.5 ng/mL — ABNORMAL LOW (ref 0.8–2.0)

## 2019-08-23 LAB — POC SARS CORONAVIRUS 2 AG -  ED: SARS Coronavirus 2 Ag: NEGATIVE

## 2019-08-23 LAB — POC OCCULT BLOOD, ED: Fecal Occult Bld: POSITIVE — AB

## 2019-08-23 LAB — PROTIME-INR
INR: 1.5 — ABNORMAL HIGH (ref 0.8–1.2)
Prothrombin Time: 17.6 seconds — ABNORMAL HIGH (ref 11.4–15.2)

## 2019-08-23 LAB — CBG MONITORING, ED: Glucose-Capillary: 201 mg/dL — ABNORMAL HIGH (ref 70–99)

## 2019-08-23 LAB — APTT: aPTT: 30 seconds (ref 24–36)

## 2019-08-23 LAB — PREPARE RBC (CROSSMATCH)

## 2019-08-23 LAB — ABO/RH: ABO/RH(D): B POS

## 2019-08-23 LAB — LACTIC ACID, PLASMA: Lactic Acid, Venous: 2.1 mmol/L (ref 0.5–1.9)

## 2019-08-23 MED ORDER — SODIUM CHLORIDE 0.9% IV SOLUTION: Freq: Once | INTRAVENOUS | Status: DC

## 2019-08-23 MED ORDER — PANTOPRAZOLE SODIUM 40 MG IV SOLR: 40.0000 mg | Freq: Two times a day (BID) | INTRAVENOUS | Status: DC

## 2019-08-23 MED ORDER — SODIUM CHLORIDE 0.9 % IV BOLUS
400.0000 mL | Freq: Once | INTRAVENOUS | Status: AC
  Administered 2019-08-23: 400 mL via INTRAVENOUS

## 2019-08-23 MED ORDER — SODIUM CHLORIDE 0.9 % IV SOLN
80.0000 mg | Freq: Once | INTRAVENOUS | Status: AC
  Administered 2019-08-23: 80 mg via INTRAVENOUS
  Filled 2019-08-23: qty 80

## 2019-08-23 MED ORDER — SODIUM CHLORIDE 0.9 % IV SOLN
8.0000 mg/h | INTRAVENOUS | Status: DC
  Administered 2019-08-23: 8 mg/h via INTRAVENOUS
  Filled 2019-08-23: qty 80

## 2019-08-23 NOTE — Consult Note (Addendum)
Consultation  Referring Provider: Dr. Johnney Killian     Primary Care Physician:  Vincente Liberty, MD Primary Gastroenterologist: Althia Forts        Reason for Consultation: Rectal bleed            HPI:   Logan Thomas is a 69 y.o. 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    Today, the patient reports that he had been constipated for about 5 days and was using a mixture of stool softener, MiraLAX and lactulose and finally had 2 large brown bowel movements yesterday and then another one this morning around 8 or 9:00 am which was accompanied by a lot of bright red blood.  He has not seen any since.  Denies any abdominal or rectal pain.    Denies fever or chills.     ER course: Hemoglobin 5.4, white blood cell count 14.4  Per chart review patient saw transplant team 08/21/2019 for follow-up and at that time discussed constipation.  Advised gentle potassium replacement and continued MiraLAX and Colace with added lactulose.  Past Medical History:  Diagnosis Date  . CHF (congestive heart failure) ([place])   . CHF (congestive heart failure) ([place])   . 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 Essential hypertension   . Hypertension     Past Surgical History:  Procedure Laterality Date  . LEFT HEART CATH AND CORONARY ANGIOGRAPHY N/A 01/08/2018   Procedure: LEFT HEART CATH AND CORONARY ANGIOGRAPHY;  Surgeon: Dixie Dials, MD;  Location: [facility];  Service: Cardiovascular;  Laterality: N/A;    Family History  Problem Relation Age of Onset  . Kidney disease Mother   . Diabetes Mother   . Heart attack Father     Social History   Tobacco Use  . Smoking status: Former Smoker    Packs/day: 0.75    Years: 14.00    Pack years: 10.50     Start date: 1971    Quit date: 1985    Years since quitting: 36.1  . Smokeless tobacco: Never Used  Substance Use Topics  . Alcohol use: No  . Drug use: Not on file    Prior to Admission medications   Medication Sig Start Date End Date Taking? Authorizing Provider  allopurinol (ZYLOPRIM) 100 MG tablet Take 1 tablet (100 mg total) by mouth daily. 01/09/18   Dixie Dials, MD  ALPRAZolam Duanne Moron) 0.25 MG tablet Take 0.25 mg by mouth 2 (two) times daily as needed for anxiety.  06/01/14   [provider]  amLODipine (NORVASC) 5 MG tablet Take 1 tablet (5 mg total) by mouth daily. 01/09/18   Dixie Dials, MD  aspirin EC 81 MG tablet Take 81 mg by mouth daily.    [provider]  BIDIL 20-37.5 MG tablet Take 1 tablet by mouth 3 (three) times daily. 12/08/17   [provider]  carvedilol (COREG) 12.5 MG tablet Take 12.5 mg by mouth 2 (two) times daily. 06/24/14   [provider]  chlorhexidine (PERIDEX) 0.12 % solution 15 mLs by Mouth Rinse route once a week. 11/14/17   [provider]  Cholecalciferol (VITAMIN D3) 5000 units TABS Take 5,000 Units by mouth daily.    [provider]  Choline Fenofibrate (FENOFIBRIC ACID) 135 MG CPDR Take 135 mg by mouth 3 (three) times a week. Monday, Wednesday and Friday    [provider]  cloNIDine (CATAPRES) 0.2 MG tablet Take 1 tablet (0.2 mg total) by mouth at bedtime. 01/09/18   Dixie Dials, MD  Co-Enzyme Q-10 30 MG CAPS Take 30 mg by mouth daily.    [provider]  digoxin (LANOXIN) 0.25 MG tablet Take 0.5 tablets (125 mcg total) by mouth every Monday, Wednesday, and Friday. 01/10/18   Dixie Dials, MD  docusate sodium (COLACE) 100 MG capsule Take 200 mg by mouth daily.     [provider]  fluticasone (FLONASE) 50 MCG/ACT nasal spray Place 1 spray into both nostrils daily as needed for allergies.     [provider]  furosemide (LASIX) 40 MG tablet Take 40 mg by mouth  2 (two) times daily. 05/27/14   [provider]  KLOR-CON M20 20 MEQ tablet Take 1 tablet (20 mEq total) by mouth 2 (two) times daily. 01/09/18   Dixie Dials, MD  Multiple Vitamins-Minerals (CENTRUM SILVER ADULT 50+) TABS Take 1 tablet by mouth daily.    [provider]  rosuvastatin (CRESTOR) 10 MG tablet Take 10 mg by mouth daily. 01/02/18   [provider]  Saw Palmetto, Serenoa repens, (SAW PALMETTO EXTRACT PO) Take 160 mg by mouth daily.    [provider]  SODIUM BICARBONATE PO Take 1,000 mg by mouth 3 (three) times daily.    [provider]  traZODone (DESYREL) 100 MG tablet Take 100 mg by mouth at bedtime. 08/01/14   [provider]    Current Facility-Administered Medications  Medication Dose Route Frequency Provider Last Rate Last Admin  . 0.9 %  sodium chloride infusion (Manually program via Guardrails IV Fluids)   Intravenous Once Charlesetta Shanks, MD      . 0.9 %  sodium chloride infusion (Manually program via Guardrails IV Fluids)   Intravenous Once Charlesetta Shanks, MD      . pantoprazole (PROTONIX) 80 mg in sodium chloride 0.9 % 100 mL IVPB  80 mg Intravenous Once Charlesetta Shanks, MD 300 mL/hr at 08/23/19 1239 80 mg at 08/23/19 1239  . pantoprazole (PROTONIX) 80 mg in sodium chloride 0.9 % 250 mL (0.32 mg/mL) infusion  8 mg/hr Intravenous Continuous Charlesetta Shanks, MD       Current Outpatient Medications  Medication Sig Dispense Refill  . allopurinol (ZYLOPRIM) 100 MG tablet Take 1 tablet (100 mg total) by mouth daily. 30 tablet 3  . ALPRAZolam (XANAX) 0.25 MG tablet Take 0.25 mg by mouth 2 (two) times daily as needed for anxiety.     Marland Kitchen amLODipine (NORVASC) 5 MG tablet Take 1 tablet (5 mg total) by mouth daily. 30 tablet 3  . aspirin EC 81 MG tablet Take 81 mg by mouth daily.    Marland Kitchen BIDIL 20-37.5 MG tablet Take 1 tablet by mouth 3 (three) times daily.  4  . carvedilol (COREG) 12.5 MG tablet Take 12.5 mg by mouth 2 (two) times  daily.    . chlorhexidine (PERIDEX) 0.12 % solution 15 mLs by Mouth Rinse route once a week.  1  . Cholecalciferol (VITAMIN D3) 5000 units TABS Take 5,000 Units by mouth daily.    . Choline Fenofibrate (FENOFIBRIC ACID) 135 MG CPDR Take 135 mg by mouth 3 (three) times a week. Monday, Wednesday and Friday    . cloNIDine (CATAPRES) 0.2 MG tablet Take 1 tablet (0.2 mg total)  by mouth at bedtime.    Marland Kitchen Co-Enzyme Q-10 30 MG CAPS Take 30 mg by mouth daily.    . digoxin (LANOXIN) 0.25 MG tablet Take 0.5 tablets (125 mcg total) by mouth every Monday, Wednesday, and Friday.    . docusate sodium (COLACE) 100 MG capsule Take 200 mg by mouth daily.     . fluticasone (FLONASE) 50 MCG/ACT nasal spray Place 1 spray into both nostrils daily as needed for allergies.     . furosemide (LASIX) 40 MG tablet Take 40 mg by mouth 2 (two) times daily.    Marland Kitchen KLOR-CON M20 20 MEQ tablet Take 1 tablet (20 mEq total) by mouth 2 (two) times daily. 60 tablet 3  . Multiple Vitamins-Minerals (CENTRUM SILVER ADULT 50+) TABS Take 1 tablet by mouth daily.    . rosuvastatin (CRESTOR) 10 MG tablet Take 10 mg by mouth daily.  4  . Saw Palmetto, Serenoa repens, (SAW PALMETTO EXTRACT PO) Take 160 mg by mouth daily.    . SODIUM BICARBONATE PO Take 1,000 mg by mouth 3 (three) times daily.    . traZODone (DESYREL) 100 MG tablet Take 100 mg by mouth at bedtime.      Allergies as of 08/23/2019 - Review Complete 07/01/2019  Allergen Reaction Noted  . Sulfamethoxazole Other (See Comments) 10/17/2017     Review of Systems:    Constitutional: No weight loss, fever or chills Skin: No rash  Cardiovascular: No chest pain  Respiratory: No SOB  Gastrointestinal: See HPI and otherwise negative Genitourinary: No dysuria  Neurological: No headache, dizziness or syncope Musculoskeletal: No new muscle or joint pain Hematologic: No bruising Psychiatric: No history of depression or anxiety    Physical Exam:  Vital signs in last 24 hours:  Temp:  [94.7 F (34.8 C)] 94.7 F (34.8 C) (01/31 1210) Pulse Rate:  [74] 74 (01/31 1210) Resp:  [22] 22 (01/31 1210) BP: (92)/(56) 92/56 (01/31 1210)   General:   Tired, Pleasant AA male appears to be in NAD, Well developed, Well nourished, alert and cooperative Head:  Normocephalic and atraumatic. Eyes:   PEERL, EOMI. No icterus. Conjunctiva pink. Ears:  Normal auditory acuity. Neck:  Supple Throat: Oral cavity and pharynx without inflammation, swelling or lesion.  Lungs: Respirations even and unlabored. Lungs clear to auscultation bilaterally.   No wheezes, crackles, or rhonchi.  Heart: Normal S1, S2. No MRG. Regular rate and rhythm. No peripheral edema, cyanosis or pallor.  Abdomen:  Soft, nondistended, nontender. No rebound or guarding. Normal bowel sounds. No appreciable masses or hepatomegaly. Rectal:  Per ED physician- maroon blood and stool in rectal vault, heme + Msk:  Symmetrical without gross deformities. Peripheral pulses intact.  Extremities:  Without edema, no deformity or joint abnormality.  Neurologic:  Alert and  oriented x4;  grossly normal neurologically.  Skin:   Dry and intact without significant lesions or rashes. Psychiatric: Demonstrates good judgement and reason without abnormal affect or behaviors.  LAB RESULTS: Recent Labs    08/23/19 1158  WBC 14.4*  HGB 5.4*  5.1*  HCT 16.0*  15.0*  PLT 196   BMET Recent Labs    08/23/19 1158  NA 133*  K 3.4*  CL 97*  GLUCOSE 213*  BUN 110*  CREATININE 8.50*   PT/INR Recent Labs    08/23/19 1207  LABPROT 17.6*  INR 1.5*    STUDIES: DG Chest Portable 1 View  Result Date: 08/23/2019 CLINICAL DATA:  GI bleeding. Fever. Vomiting. Status post kidney transplant  1 week ago. EXAM: PORTABLE CHEST 1 VIEW COMPARISON:  08/27/2014 FINDINGS: Stable cardiomegaly. Low lung volumes are noted, with mild bibasilar atelectasis. No evidence of pulmonary consolidation or edema. No evidence of pneumothorax or pleural  effusion. IMPRESSION: 1. Low lung volumes with mild bibasilar atelectasis. 2. Stable cardiomegaly. Electronically Signed   By: Marlaine Hind M.D.   On: 08/23/2019 12:13    Impression / Plan:   Impression: 1.  Rectal bleed: Reports constipation after kidney transplant, was using a mixture of stool softener, MiraLAX and lactulose and finally had 2 large bowel events yesterday which were somewhat normal for him and followed by another bowel movement this morning around 8 or 9 which had a lot of bright red blood, none since; consider stercoral ulcer versus hemorrhoids versus other 2.  Recent kidney transplant: 08/14/2019 at [facility]: 1.  Per ED physician they are planning to transfer him to [facility] where he has already been accepted.  We saw the patient here in the ER in case of need for emergent bleeding scan or other intervention. 2.  Continue supportive measures including blood transfusion 3.  We will be on call if the patient needs [place] prior to leaving for [facility].  Please call our service if any acute changes. 4.  Please await further recommendations from Dr. Tarri Glenn.  Thank you for your kind consultation, we will continue to follow while here.  Lavone Nian [facility]zenberg [facility]  08/23/2019, 12:49 PM

## 2019-08-23 NOTE — ED Provider Notes (Signed)
[place] EMERGENCY DEPARTMENT Provider Note   CSN: [id number] Arrival date & time: 08/23/19  1147     History Chief Complaint  Patient presents with  . GI Bleeding    Logan Thomas is a 69 y.o. male.  HPI Patient is status post kidney transplant reports [facility] 9 days ago.  Apparently, patient was suffering from constipation.  Patient's wife reports that he has not had a bowel movement since 1\23.  She reports that they had seen his physician and were trying stool softeners as well as recently prescribed lactulose.  Yesterday patient did have 2 bowel movements.  This morning he got up at about 3 AM and had a loose bowel movement which his wife reports she did not see.  She reports then at about 8 AM he had a bowel movement that was all blood.  She reports there was no fecal matter in it.  That time they called their provider at [facility] and were instructed to come to that facility.  She reports however by that time the patient was to limp and sweaty and she and her son were not able to transport him.  They called back and were instructed to call EMS.  There have not been any fevers.  Patient denies he is having significant amount of abdominal pain.  Denies any abdominal pain associated with this episode.  Reports he feels extremely weak and bad.  EMS had reported 2 small episodes of bloody emesis but patient denied this.  He reported there was no vomiting.  Patient's wife did not describe vomiting either.  She reports that she has been carefully measuring his fluid intake and he has been compliant with the 48 ounces of fluid daily.  She reports he has been putting out good amount of urine at goal.  He has also been taking Lasix.  Patient is not on any anticoagulants.    Past Medical History:  Diagnosis Date  . CHF (congestive heart failure) ([place])   . CHF (congestive heart failure) ([place])   . 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 Essential hypertension   . Hypertension     Patient Active Problem List   Diagnosis Date Noted  . CKD stage 5 secondary to hypertension ([place]) 05/12/2019  . Precordial chest pain 01/08/2018  . Acute coronary syndrome ([place]) 01/08/2018  . Chronic systolic heart failure ([place])   . Seizure ([place]) 08/27/2014  . Hyponatremia 08/27/2014  . Hypokalemia 08/27/2014  . Chronic kidney disease (CKD) stage G3b/A2, moderately decreased glomerular filtration rate (GFR) between 30-44 mL/min/1.73 square meter and albuminuria creatinine ratio between 30-299 mg/g ([facility]) 08/27/2014  . Hypertension   . CHF (congestive heart failure) ([place])   . Essential hypertension     Past Surgical History:  Procedure Laterality Date  . LEFT HEART CATH AND CORONARY ANGIOGRAPHY N/A 01/08/2018   Procedure: LEFT HEART CATH AND CORONARY ANGIOGRAPHY;  Surgeon: Dixie Dials, MD;  Location: [place] [facility];  Service: Cardiovascular;  Laterality: N/A;  . NEPHRECTOMY TRANSPLANTED ORGAN  08/14/2019       Family History  Problem Relation Age of Onset  . Kidney disease Mother   . Diabetes Mother   . Heart attack Father     Social History   Tobacco Use  . Smoking status: Former Smoker    Packs/day: 0.75    Years: 14.00    Pack years: 10.50    Start  date: 50    Quit date: 19    Years since quitting: 36.1  . Smokeless tobacco: Never Used  Substance Use Topics  . Alcohol use: No  . Drug use: Not on file    Home Medications Prior to Admission medications   Medication Sig Start Date End Date Taking? Authorizing Provider  ALPRAZolam (XANAX) 0.25 MG tablet Take 0.25 mg by mouth at bedtime.  06/01/14  Yes [provider]  amLODipine (NORVASC) 5 MG tablet Take 1 tablet (5 mg total) by mouth daily. 01/09/18  Yes Dixie Dials, MD  aspirin EC 81 MG tablet Take 81 mg by mouth daily.   Yes [provider]  carvedilol (COREG) 25 MG tablet Take 25 mg by mouth 2 (two) times daily. 07/31/19   Yes [provider]  cloNIDine (CATAPRES) 0.2 MG tablet Take 1 tablet (0.2 mg total) by mouth at bedtime. Patient taking differently: Take 0.2 mg by mouth 2 (two) times daily.  01/09/18  Yes Dixie Dials, MD  dapsone 25 MG tablet Take 50 mg by mouth daily. 08/17/19  Yes [provider]  digoxin (LANOXIN) 0.125 MG tablet Take 125 mcg by mouth See admin instructions. Take one tablet (125 mcg) by mouth four times weekly - Sunday, Monday, Wednesday, Friday 04/13/19  Yes [provider]  docusate sodium (COLACE) 50 MG capsule Take 50 mg by mouth 2 (two) times daily as needed for mild constipation.   Yes [provider]  fluconazole (DIFLUCAN) 50 MG tablet Take 50 mg by mouth daily. 08/17/19  Yes [provider]  furosemide (LASIX) 40 MG tablet Take 80-120 mg by mouth See admin instructions. Take three tablets (120 mg) by mouth daily at 8am and two tablets (80 mg) daily at 4pm 05/27/14  Yes [provider]  HYDROcodone-acetaminophen (NORCO/VICODIN) 5-325 MG tablet Take 1-2 tablets by mouth every 6 (six) hours as needed (pain).  08/17/19  Yes [provider]  isosorbide-hydrALAZINE (BIDIL) 20-37.5 MG tablet Take 1 tablet by mouth 3 (three) times daily.   Yes [provider]  lactulose (CHRONULAC) 10 GM/15ML solution Take 10 g by mouth 2 (two) times daily. 08/21/19  Yes [provider]  mycophenolate (MYFORTIC) 180 MG EC tablet Take 360 mg by mouth 2 (two) times daily. 08/17/19  Yes [provider]  pantoprazole (PROTONIX) 40 MG tablet Take 40 mg by mouth daily. 08/19/19  Yes [provider]  polyethylene glycol powder (GLYCOLAX/MIRALAX) 17 GM/SCOOP powder Take 17 g by mouth daily as needed (constipation). Mix in 8 oz liquid and drink 08/19/19  Yes [provider]  potassium chloride (KLOR-CON) 10 MEQ tablet Take 10 mEq by mouth daily. 08/21/19  Yes [provider]  predniSONE (DELTASONE) 5 MG tablet  Take 10 mg by mouth daily. 08/17/19  Yes [provider]  rosuvastatin (CRESTOR) 10 MG tablet Take 10 mg by mouth every Monday, Wednesday, and Friday.  01/02/18  Yes [provider]  tacrolimus (PROGRAF) 1 MG capsule Take 3-4 mg by mouth See admin instructions. Take 4 capsules (4 mg) by mouth with food at 8 am and 3 capsules (3 mg) at 8pm 08/17/19  Yes [provider]  traZODone (DESYREL) 100 MG tablet Take 100 mg by mouth at bedtime. 08/01/14  Yes [provider]  valGANciclovir (VALCYTE) 450 MG tablet Take 450 mg by mouth every Monday, Wednesday, and Friday. 08/17/19  Yes [provider]  allopurinol (ZYLOPRIM) 100 MG tablet Take 1 tablet (100 mg total) by mouth  daily. 01/09/18   Dixie Dials, MD  Choline Fenofibrate (FENOFIBRIC ACID) 135 MG CPDR Take 135 mg by mouth 3 (three) times a week.     [provider]    Allergies    Sulfamethoxazole and Sulfa antibiotics  Review of Systems   Review of Systems 10 Systems reviewed and are negative for acute change except as noted in the HPI.  Physical Exam Updated Vital Signs BP 97/65   Pulse 76   Temp 98.3 F (36.8 C) (Oral)   Resp 16   Ht 5' 11.5" (1.816 m)   Wt 83.9 kg   SpO2 100%   BMI 25.44 kg/m   Physical Exam Constitutional:      Comments: Patient arrives pale and very weak in appearance.  He does not have respiratory distress.  Does appear very ill.  HENT:     Head: Normocephalic and atraumatic.     Mouth/Throat:     Mouth: Mucous membranes are moist.     Pharynx: Oropharynx is clear.     Comments: His membranes are pale. Eyes:     Extraocular Movements: Extraocular movements intact.  Cardiovascular:     Rate and Rhythm: Normal rate and regular rhythm.  Pulmonary:     Effort: Pulmonary effort is normal.     Breath sounds: Normal breath sounds.  Abdominal:     Comments: Patient has a clean dry incision in the lower abdomen from his transplant.  No surrounding erythema.   The abdomen is soft without guarding.  Denies that it is tender to palpation.  Genitourinary:    Comments: Rectal exam has fresh, cranberry blood in the rectal vault.  Patient is not actively passing any bloody stool. Musculoskeletal:        General: Normal range of motion.     Comments: Patient has about 1+ to 2+ edema at the ankles.  The lower legs are symmetric.  The calves are soft.  Skin:    Comments: Skin is warm but pale.  Neurological:     Comments: Patient seems very fatigued and ill but is answering questions appropriately.  No focal motor deficits.  He is generally weak but can follow commands to try to assist in rolling over and repositioning.     ED Results / Procedures / Treatments   Labs (all labs ordered are listed, but only abnormal results are displayed) Labs Reviewed  COMPREHENSIVE METABOLIC PANEL - Abnormal; Notable for the following components:      Result Value   Potassium 3.2 (*)    CO2 19 (*)    Glucose, Bld 228 (*)    BUN 106 (*)    Creatinine, Ser 7.75 (*)    Calcium 7.1 (*)    Total Protein 3.8 (*)    Albumin 1.9 (*)    Alkaline Phosphatase 23 (*)    GFR calc non Af Amer 6 (*)    GFR calc Af Amer 7 (*)    All other components within normal limits  CBC - Abnormal; Notable for the following components:   WBC 14.4 (*)    RBC 1.68 (*)    Hemoglobin 5.4 (*)    HCT 16.0 (*)    All other components within normal limits  LACTIC ACID, PLASMA - Abnormal; Notable for the following components:   Lactic Acid, Venous 2.1 (*)    All other components within normal limits  PROTIME-INR - Abnormal; Notable for the following components:   Prothrombin Time 17.6 (*)    INR  1.5 (*)    All other components within normal limits  DIGOXIN LEVEL - Abnormal; Notable for the following components:   Digoxin Level 0.5 (*)    All other components within normal limits  POC OCCULT BLOOD, ED - Abnormal; Notable for the following components:   Fecal Occult Bld POSITIVE (*)     All other components within normal limits  I-STAT CHEM 8, ED - Abnormal; Notable for the following components:   Sodium 133 (*)    Potassium 3.4 (*)    Chloride 97 (*)    BUN 110 (*)    Creatinine, Ser 8.50 (*)    Glucose, Bld 213 (*)    Calcium, Ion 0.98 (*)    Hemoglobin 5.1 (*)    HCT 15.0 (*)    All other components within normal limits  CBG MONITORING, ED - Abnormal; Notable for the following components:   Glucose-Capillary 201 (*)    All other components within normal limits  RESPIRATORY PANEL BY RT PCR (FLU A&B, COVID)  CULTURE, BLOOD (ROUTINE X 2)  CULTURE, BLOOD (ROUTINE X 2)  URINE CULTURE  APTT  LACTIC ACID, PLASMA  URINALYSIS, ROUTINE W REFLEX MICROSCOPIC  POC SARS CORONAVIRUS 2 AG -  ED  TYPE AND SCREEN  PREPARE RBC (CROSSMATCH)  ABO/RH    EKG EKG Interpretation  Date/Time:  Sunday August 23 2019 11:48:11 EST Ventricular Rate:  78 PR Interval:    QRS Duration: 120 QT Interval:  435 QTC Calculation: 496 R Axis:   43 Text Interpretation: Sinus rhythm slilght increase IVCD c/w previous. PVCs no STEMI. inferior QRS depression. o/w similar to previous Confirmed by Charlesetta Shanks [phone]) on 08/23/2019 12:18:26 PM   Radiology DG Chest Portable 1 View  Result Date: 08/23/2019 CLINICAL DATA:  GI bleeding. Fever. Vomiting. Status post kidney transplant 1 week ago. EXAM: PORTABLE CHEST 1 VIEW COMPARISON:  08/27/2014 FINDINGS: Stable cardiomegaly. Low lung volumes are noted, with mild bibasilar atelectasis. No evidence of pulmonary consolidation or edema. No evidence of pneumothorax or pleural effusion. IMPRESSION: 1. Low lung volumes with mild bibasilar atelectasis. 2. Stable cardiomegaly. Electronically Signed   By: Marlaine Hind M.D.   On: 08/23/2019 12:13    Procedures Procedures (including critical care time) CRITICAL CARE Performed by: Charlesetta Shanks   Total critical care time: 60 minutes  Critical care time was exclusive of separately billable procedures  and treating other patients.  Critical care was necessary to treat or prevent imminent or life-threatening deterioration.  Critical care was time spent personally by me on the following activities: development of treatment plan with patient and/or surrogate as well as nursing, discussions with consultants, evaluation of patient's response to treatment, examination of patient, obtaining history from patient or surrogate, ordering and performing treatments and interventions, ordering and review of laboratory studies, ordering and review of radiographic studies, pulse oximetry and re-evaluation of patient's condition. Medications Ordered in ED Medications  0.9 %  sodium chloride infusion (Manually program via Guardrails IV Fluids) (has no administration in time range)  pantoprazole (PROTONIX) 80 mg in sodium chloride 0.9 % 250 mL (0.32 mg/mL) infusion (8 mg/hr Intravenous New Bag/Given 08/23/19 1311)  0.9 %  sodium chloride infusion (Manually program via Guardrails IV Fluids) (has no administration in time range)  pantoprazole (PROTONIX) 80 mg in sodium chloride 0.9 % 100 mL IVPB (80 mg Intravenous New Bag/Given 08/23/19 1239)  sodium chloride 0.9 % bolus 400 mL (0 mLs Intravenous Stopped 08/23/19 1241)    ED Course  I  have reviewed the triage vital signs and the nursing notes.  Pertinent labs & imaging results that were available during my care of the patient were reviewed by me and considered in my medical decision making (see chart for details).  Clinical Course as of Aug 22 1544  Sun Aug 23, 2019  1243 Consult: Reviewed with Dr. Roxy Horseman at [facility] at 12: 85.  He accepts the patient for transfer to [facility].  No recommendation for empiric antibiotics at this time.  Agrees with continuing blood transfusion and stabilization of blood pressures.  Consult: Reviewed with Earnie Larsson for low-power GI.  She will notify her attending regarding the patient's case in the emergency  department.  Requests call back if there is an emergent change requiring their immediate intervention.   [MP]    Clinical Course User Index [MP] Charlesetta Shanks, MD   MDM Rules/Calculators/A&P                      Patient presents as per above with GI bleed.  This appears to be lower GI bleed.  Patient denies prior history of GI bleeding is not anticoagulated.  He has been having problems with severe constipation postoperatively.  Bleeding started this morning.  Patient came in significantly hypotensive and hypovolemic.  Hemoglobin was at 5.  Emergent transfusion with uncrossed matched blood initiated for 2 units.  2 units of crossmatched blood ordered and transfused.  At current time fourth unit is being initiated.  Patient's blood pressures have improved and ranging from 90s to 100.  Patient reports feeling improved.  I have performed repeated abdominal examinations.  Patient has not developed any abdominal pain.  He denies feeling urgency for bowel movement.  Have rechecked for any production of bloody stool or blood.  To this point, patient has not had any repeat bloody bowel movement.  Continue to monitor for vital sign changes.  Anticipate transfer as patient has bed assignment. Final Clinical Impression(s) / ED Diagnoses Final diagnoses:  Lower GI bleed  Symptomatic anemia  Status post kidney transplant    Rx / DC Orders ED Discharge Orders    None       Charlesetta Shanks, MD 08/23/19 1549

## 2019-08-23 NOTE — ED Notes (Signed)
Called and spoke with spouse.  Notified her of pt's room # at [facility].

## 2019-08-23 NOTE — Progress Notes (Signed)
Notified provider of need to order antibiotics.   

## 2019-08-23 NOTE — ED Triage Notes (Addendum)
Pt here via [facility] from home for GI bleed.  1 week ago Fri underwent kidney transplant at [facility].  Since then he'd struggled with constipation and was taking stool softeners.  Pt had diarrhea and rectal bleeding last night.  Per ems, pt vomited about 100 ml of bright red blood, but pt denies.  Initially, pt was sluggish with bp of 82/40 and hr of 40.  Given 300 en-route and bp increased to 116/60 while fluids were being given, but decreased to 95/47 when fluids were stopped.  Pt ao x 4, though sluggish upon arrival.

## 2019-08-24 LAB — TYPE AND SCREEN
ABO/RH(D): B POS
Antibody Screen: NEGATIVE
Unit division: 0
Unit division: 0
Unit division: 0
Unit division: 0

## 2019-08-24 LAB — BPAM RBC
Blood Product Expiration Date: 202102172359
Blood Product Expiration Date: 202102172359
Blood Product Expiration Date: 202102202359
Blood Product Expiration Date: 202102242359
ISSUE DATE / TIME: 202101311203
ISSUE DATE / TIME: 202101311203
ISSUE DATE / TIME: 202101311332
ISSUE DATE / TIME: 202101311517
Unit Type and Rh: 5100
Unit Type and Rh: 5100
Unit Type and Rh: 7300
Unit Type and Rh: 7300

## 2019-08-28 LAB — CULTURE, BLOOD (ROUTINE X 2): Culture: NO GROWTH

## 2021-07-22 IMAGING — DX DG CHEST 1V PORT
1 series · 1 of 1 positions shown · non-contrast
Comparison: 08/27/2014

CLINICAL DATA: GI bleeding. Fever. Vomiting. Status post kidney
transplant 1 week ago.

EXAM:
PORTABLE CHEST 1 VIEW

[chest ap]
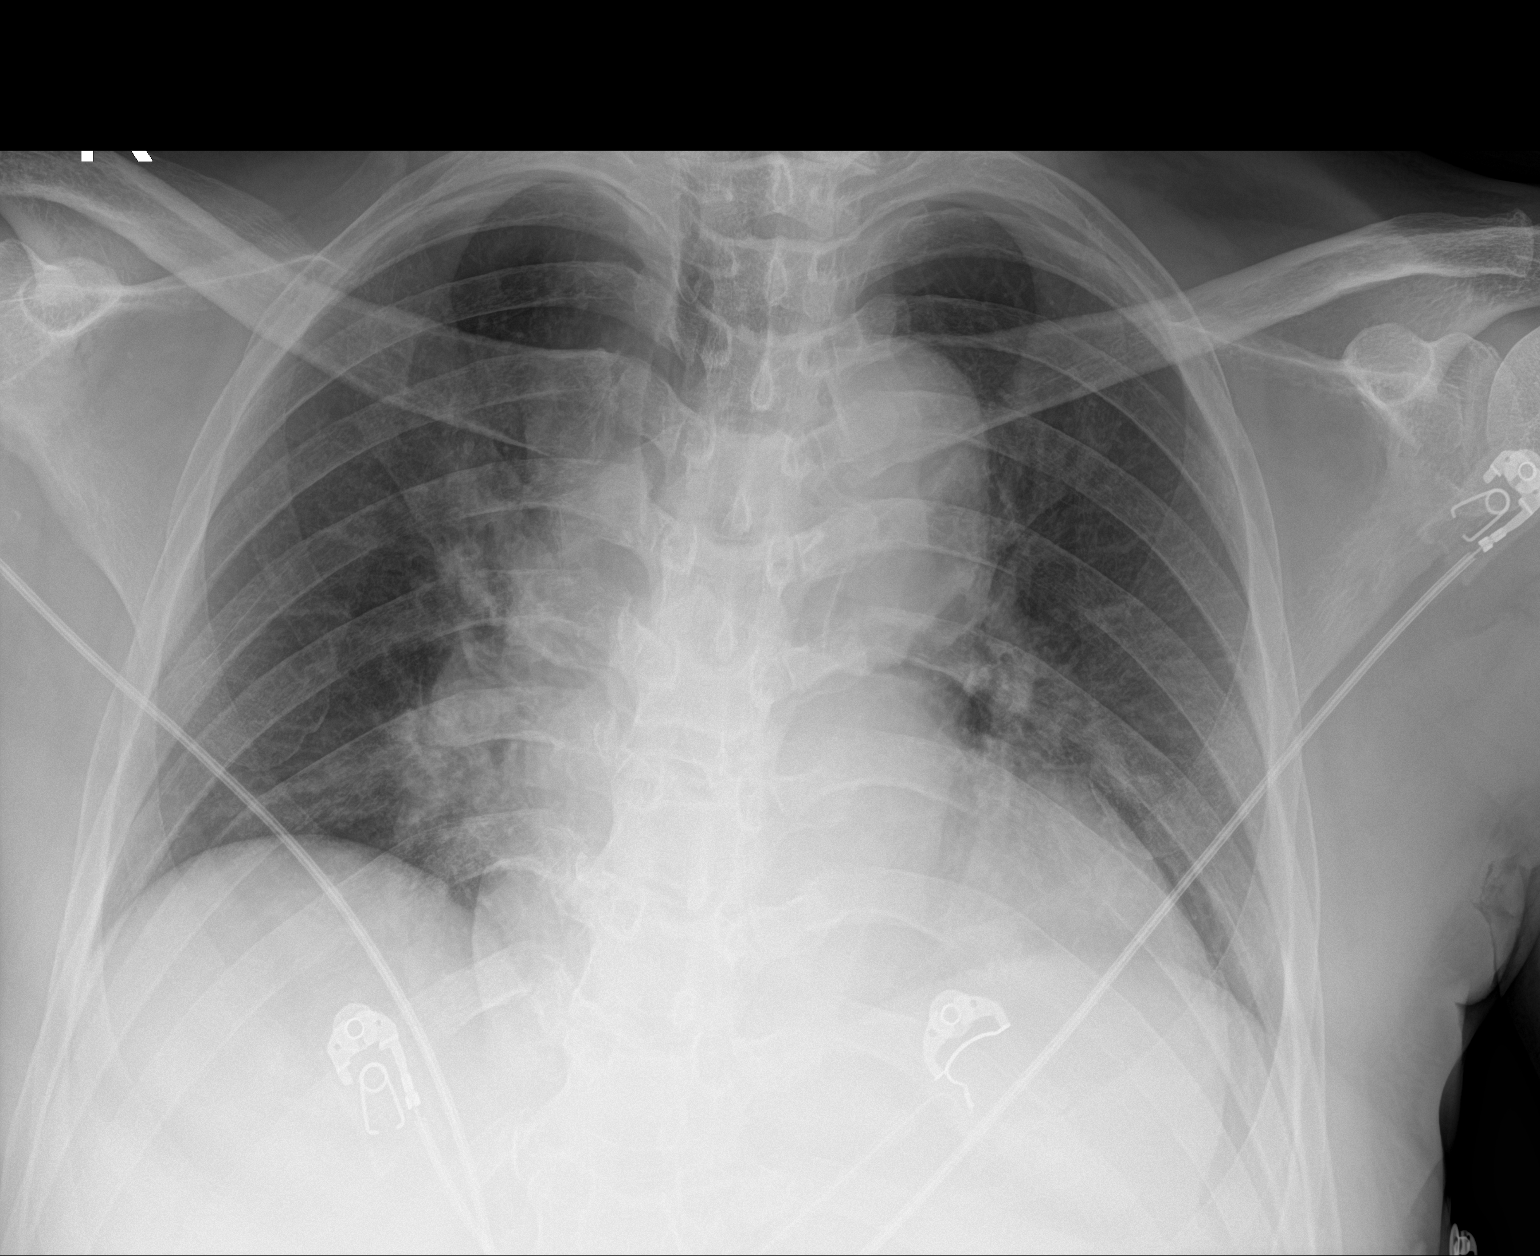

[1 of 1 positions shown; findings below may reference images not displayed]

FINDINGS: Stable cardiomegaly. Low lung volumes are noted, with mild bibasilar
atelectasis. No evidence of pulmonary consolidation or edema. No
evidence of pneumothorax or pleural effusion.
IMPRESSION: 1. Low lung volumes with mild bibasilar atelectasis.
2. Stable cardiomegaly.

## 2022-12-11 ENCOUNTER — Telehealth: Payer: Self-pay

## 2022-12-11 NOTE — Telephone Encounter (Signed)
   Patient Name: Logan Thomas  DOB: 11-18-1950 MRN: [id number]  Primary Cardiologist: None  Chart reviewed as part of pre-operative protocol coverage.  Patient does not follow with [facility].  I will route this recommendation to the requesting party via Epic fax function and remove from pre-op pool.  Please call with questions.  Joylene Grapes, NP 12/11/2022, 1:02 PM

## 2022-12-11 NOTE — Telephone Encounter (Signed)
   Pre-operative Risk Assessment    Patient Name: Logan Thomas  DOB: 03-30-1951 MRN: [id number]      Request for Surgical Clearance    Procedure:   COLONOSCOPY  Date of Surgery:  Clearance 12/20/22                                 Surgeon:  DR. Lorenso Quarry Surgeon's Group or Practice Name:  [facility]  Phone number:  [phone] Fax number:  [phone]   Type of Clearance Requested:   - Medical    Type of Anesthesia:   PROPOFOL   Additional requests/questions:    SignedMichaelle Copas   12/11/2022, 12:42 PM
